# Patient Record
Sex: Female | Born: 2000 | Race: White | Hispanic: No | Marital: Married | Smoking: Never smoker
Health system: Southern US, Community
[De-identification: ages and names within clinical notes are randomized; demographics above are authoritative.]

## PROBLEM LIST (undated history)

## (undated) DIAGNOSIS — F32A Depression, unspecified: Secondary | ICD-10-CM

## (undated) DIAGNOSIS — F99 Mental disorder, not otherwise specified: Secondary | ICD-10-CM

## (undated) DIAGNOSIS — I10 Essential (primary) hypertension: Secondary | ICD-10-CM

## (undated) HISTORY — PX: EYE SURGERY: SHX253

## (undated) HISTORY — PX: STRABISMUS SURGERY: SHX218

## (undated) HISTORY — DX: Depression, unspecified: F32.A

## (undated) HISTORY — DX: Mental disorder, not otherwise specified: F99

## (undated) HISTORY — DX: Essential (primary) hypertension: I10

---
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
## 2022-10-27 ENCOUNTER — Ambulatory Visit: Payer: 59 | Admitting: Podiatry

## 2022-10-27 DIAGNOSIS — L600 Ingrowing nail: Secondary | ICD-10-CM

## 2022-10-27 NOTE — Patient Instructions (Signed)

## 2022-10-27 NOTE — Progress Notes (Signed)
  Subjective:  Patient ID: Karen Crawford, female    DOB: 08-02-2001,  MRN: [id number]  Chief Complaint  Patient presents with   Ingrown Toenail    RIGHT GREAT TOE INGROWN    21 y.o. female presents with concern for for right hallux ingrown nail on the medial border.  She says this is been present for weeks to months.  She has tried to trim her back herself but has not been successful.  Does note that there has been some drainage in the past but not currently draining.  Has pain with pressure on the area and with wearing certain shoes.  No past medical history on file.  Allergies  Allergen Reactions   Penicillins Hives    ROS: Negative except as per HPI above  Objective:  General: AAO x3, NAD  Dermatological: Incurvation is present along the medial nail border of the right great toe. There is localized edema without any erythema or increase in warmth around the nail border. There is no drainage or pus. There is no ascending cellulitis. No malodor. No open lesions or pre-ulcerative lesions.    Vascular:  Dorsalis Pedis artery and Posterior Tibial artery pedal pulses are 2/4 bilateral.  Capillary fill time < 3 sec to all digits.   Neruologic: Grossly intact via light touch bilateral. Protective threshold intact to all sites bilateral.   Musculoskeletal: No gross boney pedal deformities bilateral. No pain, crepitus, or limitation noted with foot and ankle range of motion bilateral. Muscular strength 5/5 in all groups tested bilateral.  Gait: Unassisted, Nonantalgic.    Assessment:   1. Ingrown nail of great toe of right foot      Plan:  Patient was evaluated and treated and all questions answered.    Ingrown Nail, right -Patient elects to proceed with minor surgery to remove ingrown toenail today. Consent reviewed and signed by patient. -Ingrown nail excised. See procedure note. -Educated on post-procedure care including soaking. Written instructions provided and  reviewed. -Patient to follow up in 2 weeks for nail check.  Procedure: Excision of Ingrown Toenail Location: Right 1st toe medial nail borders. Anesthesia: Lidocaine 1% plain; 1.5 mL and Marcaine 0.5% plain; 1.5 mL, digital block. Skin Prep: Betadine. Dressing: Silvadene; telfa; dry, sterile, compression dressing. Technique: Following skin prep, the toe was exsanguinated and a tourniquet was secured at the base of the toe. The affected nail border was freed, split with a nail splitter, and excised. Chemical matrixectomy was then performed with phenol and irrigated out with alcohol. The tourniquet was then removed and sterile dressing applied. Disposition: Patient tolerated procedure well. Patient to return in 2 weeks for follow-up.    Return in about 2 weeks (around 11/10/2022) for Follow-up right hallux medial ingrown phenol matrixectomy.Corinna Gab, DPM [facility] / [facility]

## 2022-10-28 ENCOUNTER — Telehealth: Payer: Self-pay | Admitting: *Deleted

## 2022-10-28 ENCOUNTER — Encounter: Payer: Self-pay | Admitting: Podiatry

## 2022-10-28 NOTE — Telephone Encounter (Signed)
Patient is requesting an out of work note for 10/28/22 , no way she can work with this toe,is hurting really bad today, is off tomorrow and should be fine. Please email to her address on file.

## 2022-11-04 NOTE — Telephone Encounter (Signed)
Called and left voice message that work note has been completed.

## 2022-11-11 ENCOUNTER — Ambulatory Visit: Payer: 59 | Admitting: Podiatry

## 2022-11-11 DIAGNOSIS — L600 Ingrowing nail: Secondary | ICD-10-CM | POA: Diagnosis not present

## 2022-11-11 NOTE — Progress Notes (Signed)
Subjective: Karen Crawford is a 21 y.o.  female returns to office today for follow up evaluation after having right Hallux medial border nail ingrown removal with phenol and alcohol matrixectomy approximately 2 weeks ago. Patient has been soaking using epsom salts and applying topical antibiotic covered with bandaid daily. Patient denies fevers, chills, nausea, vomiting. Denies any calf pain, chest pain, SOB.   Objective:  Vitals: Reviewed  General: Well developed, nourished, in no acute distress, alert and oriented x3   Dermatology: Skin is warm, dry and supple bilateral. right hallux nail border appears to be clean, dry, with mild granular tissue and surrounding scab. There is no surrounding erythema, edema, drainage/purulence. The remaining nails appear unremarkable at this time. There are no other lesions or other signs of infection present.  Neurovascular status: Intact. No lower extremity swelling; No pain with calf compression bilateral.  Musculoskeletal: Decreased tenderness to palpation of the right hallux nail fold(s). Muscular strength within normal limits bilateral.   Assesement and Plan: S/p phenol and alcohol matrixectomy to the  right hallux nail medial border, doing well.   -Continue soaking in epsom salts twice a day followed by antibiotic ointment and a band-aid. Can leave uncovered at night. Continue this until completely healed.  -If the area has not healed in 2 weeks, call the office for follow-up appointment, or sooner if any problems arise.  -Monitor for any signs/symptoms of infection. Call the office immediately if any occur or go directly to the emergency room. Call with any questions/concerns.        Corinna Gab, DPM [facility]                   11/11/2022

## 2022-11-24 NOTE — L&D Delivery Note (Signed)
OB/GYN Faculty Practice Delivery Note  Karen Crawford is a 22 y.o. G2P1011 s/p SVD at [redacted]w[redacted]d. She was admitted for [facility].   ROM: 7h 32m with clear fluid GBS Status: Negative/-- (09/17 1157)    Labor Progress: Initial SVE: 1 cm. Outpt FB done and after it was out, she was 4 cm. AROM done and pitocin started. She was unchanged four hours later. She then progressed to complete.   Delivery Date/Time: 0223, 9/27 Delivery: Called to room and patient was complete and pushing. Head delivered LOA. Nuchal cord present. Shoulder and body delivered in usual fashion. Infant with spontaneous cry, placed on mother's abdomen, dried and stimulated. Cord clamped x 2 after 1-minute delay, and cut by spouse/FOB. Cord blood drawn. Placenta delivered spontaneously with gentle cord traction. Fundus firm with massage and Pitocin. Labia, perineum, vagina, and cervix inspected inspected with shallow vaginal abrasion repaired with single interrupted suture with 4-0 vicryl for hemostasis.   Baby Weight: 2730g  Placenta: Sent to L&D Complications: None Lacerations: Shallow vaginal abrasion EBL: 134 mL Analgesia: Epidural   Infant:  APGAR (1 MIN): 8  APGAR (5 MINS): 9  APGAR (10 MINS):

## 2023-02-02 DIAGNOSIS — Z5941 Food insecurity: Secondary | ICD-10-CM | POA: Insufficient documentation

## 2023-04-08 ENCOUNTER — Ambulatory Visit (INDEPENDENT_AMBULATORY_CARE_PROVIDER_SITE_OTHER): Payer: Medicaid Other | Admitting: Family Medicine

## 2023-04-08 ENCOUNTER — Encounter: Payer: Self-pay | Admitting: Family Medicine

## 2023-04-08 VITALS — BP 133/60 | HR 99 | Ht 64.0 in | Wt 134.0 lb

## 2023-04-08 DIAGNOSIS — F419 Anxiety disorder, unspecified: Secondary | ICD-10-CM | POA: Diagnosis not present

## 2023-04-08 DIAGNOSIS — Z3A17 17 weeks gestation of pregnancy: Secondary | ICD-10-CM

## 2023-04-08 DIAGNOSIS — Z3480 Encounter for supervision of other normal pregnancy, unspecified trimester: Secondary | ICD-10-CM

## 2023-04-08 DIAGNOSIS — I10 Essential (primary) hypertension: Secondary | ICD-10-CM

## 2023-04-08 HISTORY — DX: Encounter for supervision of other normal pregnancy, unspecified trimester: Z34.80

## 2023-04-08 MED ORDER — HYDROXYZINE PAMOATE 25 MG PO CAPS: 25.0000 mg | ORAL_CAPSULE | Freq: Three times a day (TID) | ORAL | 0 refills | Status: AC | PRN

## 2023-04-08 MED ORDER — ASPIRIN 81 MG PO TBEC
81.0000 mg | DELAYED_RELEASE_TABLET | Freq: Every day | ORAL | 2 refills | Status: DC
Start: 2023-04-08 — End: 2023-08-28

## 2023-04-08 NOTE — Progress Notes (Signed)
Subjective:  Karen Crawford is a G2P0010 [redacted]w[redacted]d being seen today for her first obstetrical visit.  She was initially seen by [facility], but transferred here in order to deliver in [place]. Her obstetrical history is significant for  [facility] . Patient does intend to breast feed. Pregnancy history fully reviewed.  Patient reports no complaints.  BP 133/60   Pulse 99   Ht 5\' 4"  (1.626 m)   Wt 134 lb (60.8 kg)   LMP 12/04/2022   BMI 23.00 kg/m   HISTORY: OB History  Gravida Para Term Preterm AB Living  2       1    SAB IAB Ectopic Multiple Live Births    1          # Outcome Date GA Lbr Len/2nd Weight Sex Delivery Anes PTL Lv  2 Current           1 IAB             Past Medical History:  Diagnosis Date   Hypertension    Mental disorder    anxiety and depression    Past Surgical History:  Procedure Laterality Date   EYE SURGERY     STRABISMUS SURGERY      Family History  Problem Relation Age of Onset   Hypertension Mother    Hypertension Maternal Grandfather    Diabetes Maternal Grandfather    Cancer Neg Hx      Exam  BP 133/60   Pulse 99   Ht 5\' 4"  (1.626 m)   Wt 134 lb (60.8 kg)   LMP 12/04/2022   BMI 23.00 kg/m   Chaperone present during exam  CONSTITUTIONAL: Well-developed, well-nourished female in no acute distress.  HENT:  Normocephalic, atraumatic, External right and left ear normal. Oropharynx is clear and moist EYES: Conjunctivae and EOM are normal. Pupils are equal, round, and reactive to light. No scleral icterus.  NECK: Normal range of motion, supple, no masses.  Normal thyroid.  CARDIOVASCULAR: Normal heart rate noted, regular rhythm RESPIRATORY: Clear to auscultation bilaterally. Effort and breath sounds normal, no problems with respiration noted. BREASTS: Previously done. ABDOMEN: Soft, normal bowel sounds, no distention noted.  No tenderness, rebound or guarding.  PELVIC: previously done MUSCULOSKELETAL: Normal range of motion. No tenderness.  No  cyanosis, clubbing, or edema.  2+ distal pulses. SKIN: Skin is warm and dry. No rash noted. Not diaphoretic. No erythema. No pallor. NEUROLOGIC: Alert and oriented to person, place, and time. Normal reflexes, muscle tone coordination. No cranial nerve deficit noted. PSYCHIATRIC: Normal mood and affect. Normal behavior. Normal judgment and thought content.    Assessment:    Pregnancy: G2P0010 Patient Active Problem List   Diagnosis Date Noted   Supervision of other normal pregnancy, antepartum 04/08/2023   Anxiety 04/08/2023   Primary hypertension 04/08/2023      Plan:   1. [redacted] weeks gestation of pregnancy - [place] MFM OB COMP + 14 WK; Future - AFP, Serum, Open Spina Bifida - Babyscripts Schedule Optimization  2. Supervision of other normal pregnancy, antepartum FHT and FH normal - [place] MFM OB COMP + 14 WK; Future - AFP, Serum, Open Spina Bifida - Ambulatory referral to [facility] - Babyscripts Schedule Optimization  3. Anxiety On prozac and buspar. Controlled Would like vistaril prn - Ambulatory referral to [facility]  4. Primary hypertension Start ASA 81mg . Needs serial [place] for growth.    Initial labs obtained Continue prenatal vitamins Reviewed n/v relief measures  and warning s/s to report Reviewed recommended weight gain based on pre-gravid BMI Encouraged well-balanced diet The nature of [place] [facility] with multiple MDs and other Advanced Practice Providers was explained to patient; also emphasized that fellows, residents, and students are part of our team.   Problem list reviewed and updated. 75% of 30 min visit spent on counseling and coordination of care.     Levie Heritage 04/08/2023

## 2023-04-10 LAB — AFP, SERUM, OPEN SPINA BIFIDA
AFP MoM: 0.76
AFP Value: 27.3 ng/mL
Gest. Age on Collection Date: 17.6 weeks
Maternal Age At EDD: 23.1 yr
OSBR Risk 1 IN: 9131
Test Results:: NEGATIVE
Weight: 135 [lb_av]

## 2023-04-13 ENCOUNTER — Telehealth: Payer: Self-pay | Admitting: Clinical

## 2023-04-13 NOTE — Telephone Encounter (Signed)
Attempt call regarding referral; Left HIPPA-compliant message to call back Lashawnna Lambrecht from [facility]  [facility] at  [phone] (Elmer Boutelle's office).   

## 2023-04-16 ENCOUNTER — Encounter: Payer: Self-pay | Admitting: Family Medicine

## 2023-04-21 ENCOUNTER — Inpatient Hospital Stay (HOSPITAL_COMMUNITY)
Admission: AD | Admit: 2023-04-21 | Discharge: 2023-04-21 | Disposition: A | Discharge status: Home / Self Care | Payer: 59 | Attending: Family Medicine | Admitting: Family Medicine

## 2023-04-21 ENCOUNTER — Encounter (HOSPITAL_COMMUNITY): Payer: Self-pay | Admitting: Family Medicine

## 2023-04-21 DIAGNOSIS — L309 Dermatitis, unspecified: Secondary | ICD-10-CM | POA: Insufficient documentation

## 2023-04-21 DIAGNOSIS — Z3A19 19 weeks gestation of pregnancy: Secondary | ICD-10-CM | POA: Insufficient documentation

## 2023-04-21 DIAGNOSIS — O26892 Other specified pregnancy related conditions, second trimester: Secondary | ICD-10-CM | POA: Diagnosis present

## 2023-04-21 DIAGNOSIS — L300 Nummular dermatitis: Secondary | ICD-10-CM

## 2023-04-21 DIAGNOSIS — Z3480 Encounter for supervision of other normal pregnancy, unspecified trimester: Secondary | ICD-10-CM

## 2023-04-21 DIAGNOSIS — L550 Sunburn of first degree: Secondary | ICD-10-CM | POA: Diagnosis not present

## 2023-04-21 MED ORDER — ACETAMINOPHEN 325 MG PO TABS: 650.0000 mg | ORAL_TABLET | Freq: Once | ORAL | Status: DC

## 2023-04-21 MED ORDER — IBUPROFEN 200 MG PO TABS: 200.0000 mg | ORAL_TABLET | Freq: Four times a day (QID) | ORAL | 0 refills | Status: DC | PRN

## 2023-04-21 MED ORDER — ACETAMINOPHEN 500 MG PO TABS: 500.0000 mg | ORAL_TABLET | Freq: Four times a day (QID) | ORAL | 0 refills | Status: DC | PRN

## 2023-04-21 NOTE — MAU Note (Signed)
.  Karen Crawford is a 22 y.o. at [redacted]w[redacted]d here in MAU reporting: got a bad sunburn yesterday. No blistering just stated it feels like her skin is going to burst. Reports using sunscreen and was only in the sun for 1 hour. Has tried Aloe and some other creams to help sooth it but nothing is helping. Back of her legs hurt the worst. LMP: Onset of complaint: yesterday Pain score: 5-8 Vitals:   04/21/23 1641  BP: 120/75  Pulse: 96  Resp: 18  Temp: 98.1 F (36.7 C)     FHT:131 Lab orders placed from triage:

## 2023-04-21 NOTE — MAU Provider Note (Signed)
History     CSN: [id number]  Arrival date and time: 04/21/23 1606   Event Date/Time   First Provider Initiated Contact with Patient 04/21/23 1810      Chief Complaint  Patient presents with   Sunburn   HPI Karen Crawford is a 22 y.o. G2P0010 at [redacted]w[redacted]d presenting with a sunburn. She reports she went the pool yesterday. She applied sunscreen-- SPH 50 and was out in the sun for about 1 hr and they reapplied. She reports most severe sx on her lower extremities and it is "ok" when lying down but very painful with standing. She took ibuprofen at about 730 this am but did not notice too much improvement.   OB History     Gravida  2   Para      Term      Preterm      AB  1   Living         SAB      IAB  1   Ectopic      Multiple      Live Births              Past Medical History:  Diagnosis Date   Hypertension    Mental disorder    anxiety and depression    Past Surgical History:  Procedure Laterality Date   EYE SURGERY     STRABISMUS SURGERY      Family History  Problem Relation Age of Onset   Hypertension Mother    Hypertension Maternal Grandfather    Diabetes Maternal Grandfather    Cancer Neg Hx     Social History   Tobacco Use   Smoking status: Never  Vaping Use   Vaping Use: Some days  Substance Use Topics   Alcohol use: No   Drug use: Not Currently    Types: Marijuana    Allergies:  Allergies  Allergen Reactions   Sulfa Antibiotics Hives and Swelling   Pollen Extract Other (See Comments)    Congestion, runny nose    No medications prior to admission.    Review of Systems  Constitutional:  Negative for chills and fever.  HENT:  Negative for congestion and sore throat.   Eyes:  Negative for pain and visual disturbance.  Respiratory:  Negative for cough, chest tightness and shortness of breath.   Cardiovascular:  Negative for chest pain.  Gastrointestinal:  Negative for abdominal pain, diarrhea, nausea and vomiting.  Endocrine:  Negative for cold intolerance and heat intolerance.  Genitourinary:  Negative for dysuria and flank pain.  Musculoskeletal:  Negative for back pain.  Skin:  Negative for rash.  Allergic/Immunologic: Negative for food allergies.  Neurological:  Negative for dizziness and light-headedness.  Psychiatric/Behavioral:  Negative for agitation.    Physical Exam   Blood pressure 120/75, pulse 96, temperature 98.1 F (36.7 C), resp. rate 18, height 5\' 4"  (1.626 m), weight 61.7 kg, last menstrual period 12/04/2022.  Physical Exam Vitals and nursing note reviewed.  Constitutional:      General: She is not in acute distress.    Appearance: She is well-developed.     Comments: Pregnant female  HENT:     Head: Normocephalic and atraumatic.  Eyes:     General: No scleral icterus.    Conjunctiva/sclera: Conjunctivae normal.  Cardiovascular:     Rate and Rhythm: Normal rate.  Pulmonary:     Effort: Pulmonary effort is normal.  Chest:     Chest wall: No tenderness.  Abdominal:     Palpations: Abdomen is soft.     Tenderness: There is no abdominal tenderness. There is no guarding or rebound.     Comments: Gravid  Genitourinary:    Vagina: Normal.  Musculoskeletal:        General: Normal range of motion.     Cervical back: Normal range of motion and neck supple.  Skin:    General: Skin is warm and dry.     Capillary Refill: Capillary refill takes less than 2 seconds.     Findings: Erythema (diffusely on ventral body- upper and lower torso and extremities) and rash present. Rash is macular (located on right side face) and scaling.  Neurological:     Mental Status: She is alert and oriented to person, place, and time.     MAU Course  Procedures  MDM- LOW  Assessment and Plan   1. Sunburn of first degree   2. Supervision of other normal pregnancy, antepartum   3. Discoid eczema    - for sunburn recommended aloe and solarcaine application. PRN tylenol or ibuprofen for pain.  Counseled on gradual improvement.  - For what appears to be discoid eczema recommended trial of OTC hydrocortisone  Future Appointments  Date Time Provider Department Center  05/06/2023  9:35 AM Levie Heritage, DO [facility] None  05/12/2023  7:30 AM [facility] NURSE [facility]  05/12/2023  7:45 AM [facility] US5 [facility]  06/03/2023  8:15 AM Adrian Blackwater, Rhona Raider, DO [facility] None    [facility] 04/23/2023, 4:04 PM

## 2023-04-21 NOTE — Discharge Instructions (Signed)
For your sunburn - drink more water - Use aloe - Consider buying solarcaine/lidocain spray  - You can use Tylenol 500mg  and Ibuprofen 200mg  to help with the pain. I recommend taking one of them every 4 hours to stay ahead of your pain   For your red patch on your face-- most likely eczema - Purchase over the counter hydrocortisone cream-- 1 apply a small amount twice a day for 1 week - Continue to use aquaphor to hydrate the area.

## 2023-04-29 ENCOUNTER — Telehealth: Payer: Self-pay | Admitting: Clinical

## 2023-04-29 NOTE — Telephone Encounter (Signed)
Attempt call regarding referral; Left HIPPA-compliant message to call back Dorothy Landgrebe from [facility] [place] [facility] at  [phone] (Levar Fayson's office).   

## 2023-05-06 ENCOUNTER — Ambulatory Visit (INDEPENDENT_AMBULATORY_CARE_PROVIDER_SITE_OTHER): Payer: Medicaid Other | Admitting: Family Medicine

## 2023-05-06 VITALS — BP 115/73 | HR 78 | Wt 138.0 lb

## 2023-05-06 DIAGNOSIS — F419 Anxiety disorder, unspecified: Secondary | ICD-10-CM

## 2023-05-06 DIAGNOSIS — Z3A21 21 weeks gestation of pregnancy: Secondary | ICD-10-CM

## 2023-05-06 DIAGNOSIS — D229 Melanocytic nevi, unspecified: Secondary | ICD-10-CM | POA: Insufficient documentation

## 2023-05-06 DIAGNOSIS — Z3480 Encounter for supervision of other normal pregnancy, unspecified trimester: Secondary | ICD-10-CM

## 2023-05-06 HISTORY — DX: Melanocytic nevi, unspecified: D22.9

## 2023-05-06 NOTE — Progress Notes (Signed)
   PRENATAL VISIT NOTE  Subjective:  Karen Crawford is a 22 y.o. G2P0010 at [redacted]w[redacted]d being seen today for ongoing prenatal care.  She is currently monitored for the following issues for this high-risk pregnancy and has Supervision of other normal pregnancy, antepartum; Anxiety; Primary hypertension; and Skin mole on their problem list.  Patient reports no complaints.  Contractions: Not present. Vag. Bleeding: None.  Movement: Present. Denies leaking of fluid.   The following portions of the patient's history were reviewed and updated as appropriate: allergies, current medications, past family history, past medical history, past social history, past surgical history and problem list.   Objective:   Vitals:   05/06/23 0939  BP: 115/73  Pulse: 78  Weight: 138 lb (62.6 kg)    Fetal Status: Fetal Heart Rate (bpm): 140   Movement: Present     General:  Alert, oriented and cooperative. Patient is in no acute distress.  Skin: Skin is warm and dry. No rash noted.   Cardiovascular: Normal heart rate noted  Respiratory: Normal respiratory effort, no problems with respiration noted  Abdomen: Soft, gravid, appropriate for gestational age.  Pain/Pressure: Absent ("normal discomfort")     Pelvic: Cervical exam deferred        Extremities: Normal range of motion.  Edema: None  Mental Status: Normal mood and affect. Normal behavior. Normal judgment and thought content.   Assessment and Plan:  Pregnancy: G2P0010 at [redacted]w[redacted]d 1. [redacted] weeks gestation of pregnancy  2. Supervision of other normal pregnancy, antepartum FHT and FH normal  3. Anxiety  4. Skin mole Has raised mole on mons, she cuts it frequently when shaving. Her previous OB was going to remove it she had an epidural. I discussed that it may be possible to remove there - if delivering provider uncomfortable with do it then, can remove in office.  Preterm labor symptoms and general obstetric precautions including but not limited to vaginal  bleeding, contractions, leaking of fluid and fetal movement were reviewed in detail with the patient. Please refer to After Visit Summary for other counseling recommendations.   No follow-ups on file.  Future Appointments  Date Time Provider Department Center  05/12/2023  7:30 AM [facility] NURSE [facility]  05/12/2023  7:45 AM [facility] US5 [facility]  06/03/2023  8:15 AM Adrian Blackwater, Rhona Raider, DO [facility] None    Levie Heritage, DO

## 2023-05-08 ENCOUNTER — Other Ambulatory Visit: Payer: Self-pay

## 2023-05-08 DIAGNOSIS — Z3A22 22 weeks gestation of pregnancy: Secondary | ICD-10-CM

## 2023-05-08 DIAGNOSIS — O10919 Unspecified pre-existing hypertension complicating pregnancy, unspecified trimester: Secondary | ICD-10-CM

## 2023-05-12 ENCOUNTER — Ambulatory Visit: Payer: 59 | Attending: Family Medicine

## 2023-05-12 ENCOUNTER — Ambulatory Visit: Payer: 59 | Admitting: *Deleted

## 2023-05-12 ENCOUNTER — Other Ambulatory Visit: Payer: Self-pay | Admitting: *Deleted

## 2023-05-12 VITALS — BP 117/69 | HR 76

## 2023-05-12 DIAGNOSIS — O99342 Other mental disorders complicating pregnancy, second trimester: Secondary | ICD-10-CM | POA: Insufficient documentation

## 2023-05-12 DIAGNOSIS — Z3A22 22 weeks gestation of pregnancy: Secondary | ICD-10-CM | POA: Insufficient documentation

## 2023-05-12 DIAGNOSIS — O10919 Unspecified pre-existing hypertension complicating pregnancy, unspecified trimester: Secondary | ICD-10-CM

## 2023-05-12 DIAGNOSIS — Z362 Encounter for other antenatal screening follow-up: Secondary | ICD-10-CM | POA: Insufficient documentation

## 2023-05-12 DIAGNOSIS — F419 Anxiety disorder, unspecified: Secondary | ICD-10-CM | POA: Diagnosis not present

## 2023-05-12 DIAGNOSIS — O10912 Unspecified pre-existing hypertension complicating pregnancy, second trimester: Secondary | ICD-10-CM | POA: Insufficient documentation

## 2023-05-12 DIAGNOSIS — O169 Unspecified maternal hypertension, unspecified trimester: Secondary | ICD-10-CM

## 2023-05-20 ENCOUNTER — Inpatient Hospital Stay (HOSPITAL_COMMUNITY)
Admission: AD | Admit: 2023-05-20 | Discharge: 2023-05-20 | Disposition: A | Discharge status: Home / Self Care | Payer: 59 | Attending: Family Medicine | Admitting: Family Medicine

## 2023-05-20 ENCOUNTER — Encounter (HOSPITAL_COMMUNITY): Payer: Self-pay | Admitting: Family Medicine

## 2023-05-20 DIAGNOSIS — Z3492 Encounter for supervision of normal pregnancy, unspecified, second trimester: Secondary | ICD-10-CM | POA: Diagnosis not present

## 2023-05-20 DIAGNOSIS — O26892 Other specified pregnancy related conditions, second trimester: Secondary | ICD-10-CM | POA: Diagnosis not present

## 2023-05-20 DIAGNOSIS — O368120 Decreased fetal movements, second trimester, not applicable or unspecified: Secondary | ICD-10-CM | POA: Diagnosis present

## 2023-05-20 DIAGNOSIS — Z3A23 23 weeks gestation of pregnancy: Secondary | ICD-10-CM | POA: Diagnosis not present

## 2023-05-20 NOTE — MAU Provider Note (Signed)
History     CSN: [id number]  Arrival date and time: 05/20/23 1342   Event Date/Time   First Provider Initiated Contact with Patient 05/20/23 1441      Chief Complaint  Patient presents with   Decreased Fetal Movement   Abdominal Pain   HPI [name] is a 22 y.o. year old G81P0010 female at [redacted]w[redacted]d weeks gestation who presents to MAU reporting DFM x 26 hours. She reports she last felt a "subtle" kick last night prior to bed. She denies feeling any fetal movement at all today. She reports she is used to feeling FM all day. She also reports feeling occasionally feeling lower abdominal discomfort last night every few hours; rated 5/10. She denies VB or LOF.  She receives [facility] with [facility]; next appt is 06/03/2023.   OB History     Gravida  2   Para      Term      Preterm      AB  1   Living         SAB      IAB  1   Ectopic      Multiple      Live Births              Past Medical History:  Diagnosis Date   Depression    Hypertension    Mental disorder    anxiety and depression    Past Surgical History:  Procedure Laterality Date   EYE SURGERY     STRABISMUS SURGERY      Family History  Problem Relation Age of Onset   Hypertension Mother    Hypertension Maternal Grandfather    Diabetes Maternal Grandfather    Cancer Neg Hx     Social History   Tobacco Use   Smoking status: Never  Vaping Use   Vaping Use: Some days   Substances: Nicotine, Flavoring  Substance Use Topics   Alcohol use: No   Drug use: Not Currently    Allergies:  Allergies  Allergen Reactions   Sulfa Antibiotics Hives and Swelling   Pollen Extract Other (See Comments)    Congestion, runny nose    Medications Prior to Admission  Medication Sig Dispense Refill Last Dose   aspirin EC 81 MG tablet Take 1 tablet (81 mg total) by mouth daily. Take after 12 weeks for prevention of preeclampsia later in pregnancy 300 tablet 2 05/19/2023   busPIRone (BUSPAR) 10 MG tablet  Take 10 mg by mouth daily.   05/20/2023   FLUoxetine (PROZAC) 40 MG capsule Take 40 mg by mouth daily.   05/20/2023   hydrOXYzine (VISTARIL) 25 MG capsule Take 1 capsule (25 mg total) by mouth 3 (three) times daily as needed for anxiety. 30 capsule 0 Past Month   Prenatal Vit-Fe Fumarate-FA (PRENATAL MULTIVITAMIN) TABS tablet Take 1 tablet by mouth daily at 12 noon.   05/20/2023   acetaminophen (TYLENOL) 500 MG tablet Take 1 tablet (500 mg total) by mouth every 6 (six) hours as needed. 30 tablet 0    ibuprofen (ADVIL) 200 MG tablet Take 1 tablet (200 mg total) by mouth every 6 (six) hours as needed for mild pain (Do not use after 26 weeks of pregnancy). (Patient not taking: Reported on 05/12/2023) 30 tablet 0     Review of Systems  Constitutional: Negative.   HENT: Negative.    Eyes: Negative.   Respiratory: Negative.    Cardiovascular: Negative.   Gastrointestinal: Negative.  Endocrine: Negative.   Genitourinary:        DFM x 26 hrs  Musculoskeletal: Negative.   Skin: Negative.   Allergic/Immunologic: Negative.   Neurological: Negative.   Hematological: Negative.   Psychiatric/Behavioral: Negative.     Physical Exam   Blood pressure 130/77, pulse (!) 102, temperature 98.2 F (36.8 C), temperature source Oral, resp. rate 20, height 5\' 4"  (1.626 m), weight 63.5 kg, last menstrual period 12/04/2022, SpO2 100 %.  Physical Exam Vitals and nursing note reviewed.  Constitutional:      Appearance: Normal appearance. She is normal weight.  Cardiovascular:     Rate and Rhythm: Tachycardia present.  Pulmonary:     Effort: Pulmonary effort is normal.  Abdominal:     Palpations: Abdomen is soft.  Genitourinary:    Comments: Not indicated Skin:    General: Skin is warm and dry.  Neurological:     Mental Status: She is alert and oriented to person, place, and time.  Psychiatric:        Mood and Affect: Mood normal.        Behavior: Behavior normal.        Thought Content: Thought  content normal.        Judgment: Judgment normal.    REACTIVE NST - FHR: 130 bpm / moderate variability / accels present / decels absent / TOCO: none  MAU Course  Procedures  MDM CEFM  Assessment and Plan  1. Movement of fetus present during pregnancy in second trimester - Reassurance given that FHR tracing shows good fetal well-being - Patient reassured that she is feeling more FM since arriving to MAU  2. [redacted] weeks gestation of pregnancy   - Discharge patient - Keep scheduled appt with [facility] on 06/03/2023 - Patient verbalized an understanding of the plan of care and agrees.  Raelyn Mora, CNM 05/20/2023, 2:41 PM

## 2023-05-20 NOTE — MAU Note (Signed)
...  Karen Crawford is a 22 y.o. at [redacted]w[redacted]d here in MAU reporting: DFM for the past "26 hours." She reports she last felt a subtle kick last night prior to bed. Has not felt any movement all day today. She reports she is used to feeling constant movement all day. She reports last night she began experiencing occasional lower abdominal discomfort that occurs every few hours. Denies VB or LOF.   Onset of complaint: Last night Pain score: 5/10 lower abdomen  FHT: 135 initial external Lab orders placed from triage:  none - Fetal movement clicker given

## 2023-05-30 ENCOUNTER — Inpatient Hospital Stay (HOSPITAL_COMMUNITY)
Admission: AD | Admit: 2023-05-30 | Discharge: 2023-05-31 | Disposition: A | Discharge status: Home / Self Care | Payer: Medicaid Other | Attending: Obstetrics and Gynecology | Admitting: Obstetrics and Gynecology

## 2023-05-30 ENCOUNTER — Encounter (HOSPITAL_COMMUNITY): Payer: Self-pay | Admitting: Obstetrics and Gynecology

## 2023-05-30 DIAGNOSIS — Z3A25 25 weeks gestation of pregnancy: Secondary | ICD-10-CM | POA: Diagnosis not present

## 2023-05-30 DIAGNOSIS — M545 Low back pain, unspecified: Secondary | ICD-10-CM | POA: Insufficient documentation

## 2023-05-30 DIAGNOSIS — O10912 Unspecified pre-existing hypertension complicating pregnancy, second trimester: Secondary | ICD-10-CM | POA: Diagnosis not present

## 2023-05-30 DIAGNOSIS — O9A212 Injury, poisoning and certain other consequences of external causes complicating pregnancy, second trimester: Secondary | ICD-10-CM

## 2023-05-30 DIAGNOSIS — F32A Depression, unspecified: Secondary | ICD-10-CM | POA: Diagnosis not present

## 2023-05-30 DIAGNOSIS — W19XXXA Unspecified fall, initial encounter: Secondary | ICD-10-CM | POA: Insufficient documentation

## 2023-05-30 DIAGNOSIS — O99342 Other mental disorders complicating pregnancy, second trimester: Secondary | ICD-10-CM | POA: Insufficient documentation

## 2023-05-30 DIAGNOSIS — Z6790 Unspecified blood type, Rh positive: Secondary | ICD-10-CM

## 2023-05-30 DIAGNOSIS — F419 Anxiety disorder, unspecified: Secondary | ICD-10-CM | POA: Insufficient documentation

## 2023-05-30 DIAGNOSIS — O26892 Other specified pregnancy related conditions, second trimester: Secondary | ICD-10-CM | POA: Diagnosis not present

## 2023-05-30 DIAGNOSIS — Z3480 Encounter for supervision of other normal pregnancy, unspecified trimester: Secondary | ICD-10-CM

## 2023-05-30 NOTE — MAU Note (Signed)
.  Karen Crawford is a 22 y.o. at [redacted]w[redacted]d here in MAU reporting: around 7pm tonight pt fell at work  on he right side . Did not think much of it . Has been having paint o right side and some abd cramping. Reports good fetal movement since fall and denies any vag bleeding or leaking.   Onset of complaint: 7pm Pain score: 4-5 Vitals:   05/30/23 2239  BP: 127/68  Pulse: 100  Resp: 18  Temp: 98.2 F (36.8 C)     FHT:147 Lab orders placed from triage:

## 2023-05-31 ENCOUNTER — Inpatient Hospital Stay (HOSPITAL_BASED_OUTPATIENT_CLINIC_OR_DEPARTMENT_OTHER): Payer: Medicaid Other

## 2023-05-31 DIAGNOSIS — M549 Dorsalgia, unspecified: Secondary | ICD-10-CM | POA: Diagnosis not present

## 2023-05-31 DIAGNOSIS — O9A212 Injury, poisoning and certain other consequences of external causes complicating pregnancy, second trimester: Secondary | ICD-10-CM | POA: Diagnosis not present

## 2023-05-31 DIAGNOSIS — W010XXA Fall on same level from slipping, tripping and stumbling without subsequent striking against object, initial encounter: Secondary | ICD-10-CM | POA: Diagnosis not present

## 2023-05-31 DIAGNOSIS — Z3A25 25 weeks gestation of pregnancy: Secondary | ICD-10-CM

## 2023-05-31 DIAGNOSIS — R109 Unspecified abdominal pain: Secondary | ICD-10-CM

## 2023-05-31 LAB — URINALYSIS, ROUTINE W REFLEX MICROSCOPIC
Bilirubin Urine: NEGATIVE
Glucose, UA: NEGATIVE mg/dL
Hgb urine dipstick: NEGATIVE
Ketones, ur: NEGATIVE mg/dL
Nitrite: NEGATIVE
Protein, ur: NEGATIVE mg/dL
Specific Gravity, Urine: 1.016 (ref 1.005–1.030)
pH: 6 (ref 5.0–8.0)

## 2023-05-31 MED ORDER — CYCLOBENZAPRINE HCL 5 MG PO TABS: 5.0000 mg | ORAL_TABLET | Freq: Three times a day (TID) | ORAL | 0 refills | Status: DC | PRN

## 2023-05-31 MED ORDER — CALCIUM CARBONATE ANTACID 500 MG PO CHEW: 2.0000 | CHEWABLE_TABLET | Freq: Once | ORAL | Status: DC

## 2023-05-31 MED ORDER — CYCLOBENZAPRINE HCL 5 MG PO TABS
10.0000 mg | ORAL_TABLET | Freq: Once | ORAL | Status: AC
  Administered 2023-05-31: 10 mg via ORAL
  Filled 2023-05-31: qty 2

## 2023-05-31 NOTE — MAU Provider Note (Signed)
Chief Complaint:  Fall   Event Date/Time   First Provider Initiated Contact with Patient 05/31/23 0021      HPI: Karen Crawford is a 22 y.o. G2P0010 at 104w3d by early ultrasound who presents to maternity admissions reporting she was at work in the kitchen, carrying a tray with hot water for coffee, and slipped. She did not want to spill the hot water onto herself so she held the tray and did not brace herself for the fall. She landed on her right side. After the fall she had some painful cramping but this has improved since arrival to MAU.  She has constant pain in her right lower back since the fall.  She reports good fetal movement, denies LOF or vaginal bleeding    HPI  Past Medical History: Past Medical History:  Diagnosis Date   Depression    Hypertension    Mental disorder    anxiety and depression    Past obstetric history: OB History  Gravida Para Term Preterm AB Living  2       1    SAB IAB Ectopic Multiple Live Births    1          # Outcome Date GA Lbr Len/2nd Weight Sex Delivery Anes PTL Lv  2 Current           1 IAB             Past Surgical History: Past Surgical History:  Procedure Laterality Date   EYE SURGERY     STRABISMUS SURGERY      Family History: Family History  Problem Relation Age of Onset   Hypertension Mother    Hypertension Maternal Grandfather    Diabetes Maternal Grandfather    Cancer Neg Hx     Social History: Social History   Tobacco Use   Smoking status: Never  Vaping Use   Vaping Use: Some days   Substances: Nicotine, Flavoring  Substance Use Topics   Alcohol use: No   Drug use: Not Currently    Allergies:  Allergies  Allergen Reactions   Sulfa Antibiotics Hives and Swelling   Pollen Extract Other (See Comments)    Congestion, runny nose    Meds:  No medications prior to admission.    ROS:  Review of Systems  Constitutional:  Negative for chills, fatigue and fever.  Eyes:  Negative for visual disturbance.   Respiratory:  Negative for shortness of breath.   Cardiovascular:  Negative for chest pain.  Gastrointestinal:  Positive for abdominal pain. Negative for nausea and vomiting.  Genitourinary:  Negative for difficulty urinating, dysuria, flank pain, pelvic pain, vaginal bleeding, vaginal discharge and vaginal pain.  Musculoskeletal:  Positive for back pain.  Neurological:  Negative for dizziness and headaches.  Psychiatric/Behavioral: Negative.       I have reviewed patient's Past Medical Hx, Surgical Hx, Family Hx, Social Hx, medications and allergies.   Physical Exam  Patient Vitals for the past 24 hrs:  BP Temp Pulse Resp SpO2 Height Weight  05/31/23 0325 127/78 -- 79 -- -- -- --  05/31/23 0320 -- -- -- -- 100 % -- --  05/31/23 0315 -- -- -- -- 99 % -- --  05/31/23 0310 -- -- -- -- 99 % -- --  05/31/23 0305 -- -- -- -- 99 % -- --  05/31/23 0300 -- -- -- -- 99 % -- --  05/31/23 0255 -- -- -- -- 99 % -- --  05/31/23 0250 -- -- -- --  99 % -- --  05/31/23 0245 -- -- -- -- 99 % -- --  05/31/23 0240 -- -- -- -- 99 % -- --  05/31/23 0235 -- -- -- -- 99 % -- --  05/31/23 0230 -- -- -- -- 99 % -- --  05/31/23 0225 -- -- -- -- 99 % -- --  05/31/23 0220 -- -- -- -- 99 % -- --  05/31/23 0215 -- -- -- -- 98 % -- --  05/31/23 0210 -- -- -- -- 98 % -- --  05/31/23 0205 -- -- -- -- 99 % -- --  05/31/23 0200 -- -- -- -- 99 % -- --  05/30/23 2239 127/68 98.2 F (36.8 C) 100 18 -- 5\' 4"  (1.626 m) 65.4 kg   Constitutional: Well-developed, well-nourished female in no acute distress.  Cardiovascular: normal rate Respiratory: normal effort GI: Abd soft, non-tender, gravid appropriate for gestational age.  MS: Extremities nontender, no edema, normal ROM Neurologic: Alert and oriented x 4.  GU: Neg CVAT.  PELVIC EXAM: deferred     FHT:  Baseline 135 , moderate variability, accelerations present, no decelerations Contractions: none on toco or to palpation   Blood type O positive in  prenatal record  Labs: Results for orders placed or performed during the hospital encounter of 05/30/23 (from the past 24 hour(s))  Urinalysis, Routine w reflex microscopic -Urine, Clean Catch     Status: Abnormal   Collection Time: 05/30/23 11:12 PM  Result Value Ref Range   Color, Urine YELLOW YELLOW   APPearance HAZY (A) CLEAR   Specific Gravity, Urine 1.016 1.005 - 1.030   pH 6.0 5.0 - 8.0   Glucose, UA NEGATIVE NEGATIVE mg/dL   Hgb urine dipstick NEGATIVE NEGATIVE   Bilirubin Urine NEGATIVE NEGATIVE   Ketones, ur NEGATIVE NEGATIVE mg/dL   Protein, ur NEGATIVE NEGATIVE mg/dL   Nitrite NEGATIVE NEGATIVE   Leukocytes,Ua TRACE (A) NEGATIVE   RBC / HPF 0-5 0 - 5 RBC/hpf   WBC, UA 0-5 0 - 5 WBC/hpf   Bacteria, UA RARE (A) NONE SEEN   Squamous Epithelial / HPF 6-10 0 - 5 /HPF   Mucus PRESENT       Imaging:   MAU Course/MDM: Orders Placed This Encounter  Procedures   [place] MFM OB Limited   Urinalysis, Routine w reflex microscopic -Urine, Clean Catch   Discharge patient    Meds ordered this encounter  Medications   cyclobenzaprine (FLEXERIL) tablet 10 mg   calcium carbonate (TUMS - dosed in mg elemental calcium) chewable tablet 400 mg of elemental calcium   cyclobenzaprine (FLEXERIL) 5 MG tablet    Sig: Take 1-2 tablets (5-10 mg total) by mouth 3 (three) times daily as needed for muscle spasms.    Dispense:  20 tablet    Refill:  0    Order Specific Question:   Supervising Provider    Answer:   Lennart Pall [[id number]]     NST reviewed and appropriate for gestational age x 4+ hours Pt with right side/right lower back pain that is constant, c/w MSK pain No cramping/contractions Flexeril 10 mg PO given in MAU and pt pain improved D/C home with return precautions Keep scheduled appts in office Rx for Flexeril  Assessment: 1. Supervision of other normal pregnancy, antepartum   2. Traumatic injury during pregnancy in second trimester   3. Fall, initial encounter    4. [redacted] weeks gestation of pregnancy   5. Blood type, Rh positive  Plan: Discharge home Labor precautions and fetal kick counts  Follow-up Information     [facility] Follow up.   Specialty: Obstetrics and Gynecology Why: As scheduled Contact information: [address] [place] [address] [phone]        [facility] Follow up.   Specialty: Obstetrics and Gynecology Why: As needed for emergencies Contact information: [address] [id number] Wilhemina Bonito [place] [address] [phone]               Allergies as of 05/31/2023       Reactions   Sulfa Antibiotics Hives, Swelling   Pollen Extract Other (See Comments)   Congestion, runny nose        Medication List     TAKE these medications    acetaminophen 500 MG tablet Commonly known as: TYLENOL Take 1 tablet (500 mg total) by mouth every 6 (six) hours as needed.   aspirin EC 81 MG tablet Take 1 tablet (81 mg total) by mouth daily. Take after 12 weeks for prevention of preeclampsia later in pregnancy   busPIRone 10 MG tablet Commonly known as: BUSPAR Take 10 mg by mouth daily.   cyclobenzaprine 5 MG tablet Commonly known as: FLEXERIL Take 1-2 tablets (5-10 mg total) by mouth 3 (three) times daily as needed for muscle spasms.   FLUoxetine 40 MG capsule Commonly known as: PROZAC Take 40 mg by mouth daily.   hydrOXYzine 25 MG capsule Commonly known as: VISTARIL Take 1 capsule (25 mg total) by mouth 3 (three) times daily as needed for anxiety.   prenatal multivitamin Tabs tablet Take 1 tablet by mouth daily at 12 noon.        Sharen Counter Certified Nurse-Midwife 05/31/2023 8:39 AM   Nausea and Vomiting of Pregnancy Treatment Regimen:  Start on Day 1:  Before Breakfast: Prilosec 20 mg Vitamin B6 (100 mg) Phenergan 12.5 mg (1/2 tablet)  At Bedtime: Vitamin B6  (100 mg) Unisom (doxylamine 25 mg)  If symptoms do not improve:  Add 1/2 Unisom tablet at breakfast and another half at lunch, for a total of 2 tablets/day  You may also replace the bedtime Unisom with Phenergan (12.5 or 25 mg) if it works better  And, take Zofran 4 mg tablets every 6 hours in addition to these medications if you still can't keep down liquids or food

## 2023-06-03 ENCOUNTER — Ambulatory Visit (INDEPENDENT_AMBULATORY_CARE_PROVIDER_SITE_OTHER): Payer: Medicaid Other | Admitting: Family Medicine

## 2023-06-03 VITALS — BP 114/68 | HR 74 | Wt 141.0 lb

## 2023-06-03 DIAGNOSIS — I10 Essential (primary) hypertension: Secondary | ICD-10-CM

## 2023-06-03 DIAGNOSIS — F419 Anxiety disorder, unspecified: Secondary | ICD-10-CM

## 2023-06-03 DIAGNOSIS — Z3480 Encounter for supervision of other normal pregnancy, unspecified trimester: Secondary | ICD-10-CM

## 2023-06-03 DIAGNOSIS — Z3482 Encounter for supervision of other normal pregnancy, second trimester: Secondary | ICD-10-CM

## 2023-06-03 DIAGNOSIS — Z3A25 25 weeks gestation of pregnancy: Secondary | ICD-10-CM

## 2023-06-03 LAB — OB RESULTS CONSOLE RUBELLA ANTIBODY, IGM: Rubella: IMMUNE

## 2023-06-03 NOTE — Progress Notes (Signed)
   PRENATAL VISIT NOTE  Subjective:  Karen Crawford is a 22 y.o. G2P0010 at [redacted]w[redacted]d being seen today for ongoing prenatal care.  She is currently monitored for the following issues for this high-risk pregnancy and has Supervision of other normal pregnancy, antepartum; Anxiety; Primary hypertension; and Skin mole on their problem list.  Patient reports no complaints.  Contractions: Not present. Vag. Bleeding: None.  Movement: Present. Denies leaking of fluid.   The following portions of the patient's history were reviewed and updated as appropriate: allergies, current medications, past family history, past medical history, past social history, past surgical history and problem list.   Objective:   Vitals:   06/03/23 0816  BP: 114/68  Pulse: 74  Weight: 141 lb (64 kg)    Fetal Status: Fetal Heart Rate (bpm): 133   Movement: Present     General:  Alert, oriented and cooperative. Patient is in no acute distress.  Skin: Skin is warm and dry. No rash noted.   Cardiovascular: Normal heart rate noted  Respiratory: Normal respiratory effort, no problems with respiration noted  Abdomen: Soft, gravid, appropriate for gestational age.  Pain/Pressure: Absent     Pelvic: Cervical exam deferred        Extremities: Normal range of motion.  Edema: None  Mental Status: Normal mood and affect. Normal behavior. Normal judgment and thought content.   Assessment and Plan:  Pregnancy: G2P0010 at [redacted]w[redacted]d 1. [redacted] weeks gestation of pregnancy  - CBC - Glucose Tolerance, 2 Hours w/1 Hour - HIV Antibody (routine testing w rflx) - RPR  2. Supervision of other normal pregnancy, antepartum FHT and FH normal Wants to do skin to skin after baby is born - we discussed that this is normal occurrence at our hospital Also desires delayed cord clamping - affirmed that this is also normal Looking for pediatrician  - CBC - Glucose Tolerance, 2 Hours w/1 Hour - HIV Antibody (routine testing w rflx) - RPR  3.  Anxiety controlled  4. Primary hypertension BP controlled On asa 8amg  Preterm labor symptoms and general obstetric precautions including but not limited to vaginal bleeding, contractions, leaking of fluid and fetal movement were reviewed in detail with the patient. Please refer to After Visit Summary for other counseling recommendations.   No follow-ups on file.  Future Appointments  Date Time Provider Department Center  06/11/2023  7:30 AM [facility] NURSE [facility]  06/11/2023  7:45 AM [facility] US4 [facility]  07/02/2023 10:35 AM Milas Hock, MD [facility] None  07/22/2023 10:35 AM Levie Heritage, DO [facility] None    Levie Heritage, DO

## 2023-06-04 LAB — CBC
Hematocrit: 39.1 % (ref 34.0–46.6)
Hemoglobin: 13.1 g/dL (ref 11.1–15.9)
MCH: 32 pg (ref 26.6–33.0)
MCHC: 33.5 g/dL (ref 31.5–35.7)
MCV: 95 fL (ref 79–97)
Platelets: 268 10*3/uL (ref 150–450)
RBC: 4.1 x10E6/uL (ref 3.77–5.28)
RDW: 12.2 % (ref 11.7–15.4)
WBC: 10.4 10*3/uL (ref 3.4–10.8)

## 2023-06-04 LAB — RPR: RPR Ser Ql: NONREACTIVE

## 2023-06-04 LAB — GLUCOSE TOLERANCE, 2 HOURS W/ 1HR
Glucose, 1 hour: 103 mg/dL (ref 70–179)
Glucose, 2 hour: 101 mg/dL (ref 70–152)
Glucose, Fasting: 68 mg/dL — ABNORMAL LOW (ref 70–91)

## 2023-06-04 LAB — HIV ANTIBODY (ROUTINE TESTING W REFLEX): HIV Screen 4th Generation wRfx: NONREACTIVE

## 2023-06-11 ENCOUNTER — Ambulatory Visit: Payer: Medicaid Other | Attending: Obstetrics

## 2023-06-11 ENCOUNTER — Ambulatory Visit: Payer: Medicaid Other | Admitting: *Deleted

## 2023-06-11 ENCOUNTER — Other Ambulatory Visit: Payer: Self-pay | Admitting: *Deleted

## 2023-06-11 VITALS — BP 135/75 | HR 66

## 2023-06-11 DIAGNOSIS — I10 Essential (primary) hypertension: Secondary | ICD-10-CM

## 2023-06-11 DIAGNOSIS — Z362 Encounter for other antenatal screening follow-up: Secondary | ICD-10-CM

## 2023-06-11 DIAGNOSIS — O10012 Pre-existing essential hypertension complicating pregnancy, second trimester: Secondary | ICD-10-CM

## 2023-06-11 DIAGNOSIS — Z3A27 27 weeks gestation of pregnancy: Secondary | ICD-10-CM | POA: Diagnosis not present

## 2023-06-11 DIAGNOSIS — O169 Unspecified maternal hypertension, unspecified trimester: Secondary | ICD-10-CM | POA: Diagnosis not present

## 2023-06-11 DIAGNOSIS — Z3689 Encounter for other specified antenatal screening: Secondary | ICD-10-CM

## 2023-07-02 ENCOUNTER — Encounter: Payer: Medicaid Other | Admitting: Obstetrics and Gynecology

## 2023-07-06 ENCOUNTER — Encounter: Payer: Self-pay | Admitting: Obstetrics and Gynecology

## 2023-07-06 ENCOUNTER — Telehealth (INDEPENDENT_AMBULATORY_CARE_PROVIDER_SITE_OTHER): Payer: Medicaid Other | Admitting: Obstetrics and Gynecology

## 2023-07-06 VITALS — BP 135/85 | HR 79

## 2023-07-06 DIAGNOSIS — F419 Anxiety disorder, unspecified: Secondary | ICD-10-CM

## 2023-07-06 DIAGNOSIS — I10 Essential (primary) hypertension: Secondary | ICD-10-CM

## 2023-07-06 DIAGNOSIS — Z3480 Encounter for supervision of other normal pregnancy, unspecified trimester: Secondary | ICD-10-CM

## 2023-07-06 DIAGNOSIS — Z3A30 30 weeks gestation of pregnancy: Secondary | ICD-10-CM

## 2023-07-06 NOTE — Patient Instructions (Signed)
[place] Childbirth Education: Classes can vary in availability and schedule is subject to change. For most up-to-date information please visit [email or web address] to review and register.

## 2023-07-06 NOTE — Progress Notes (Signed)
ROB: denies any concerns, stating she has questions

## 2023-07-06 NOTE — Progress Notes (Signed)
OBSTETRICS PRENATAL VIRTUAL VISIT ENCOUNTER NOTE  Provider location: [facility]   Patient location: Home  I connected with [name] on 07/06/23 at 10:55 AM EDT by MyChart Video Encounter and verified that I am speaking with the correct person using two identifiers. I discussed the limitations, risks, security and privacy concerns of performing an evaluation and management service virtually and the availability of in person appointments. I also discussed with the patient that there may be a patient responsible charge related to this service. The patient expressed understanding and agreed to proceed. Subjective:  Karen Crawford is a 22 y.o. G2P0010 at [redacted]w[redacted]d being seen today for ongoing prenatal care.  She is currently monitored for the following issues for this low-risk pregnancy and has Supervision of other normal pregnancy, antepartum; Anxiety; Primary hypertension; Skin mole; and Food insecurity on their problem list.  Patient reports no complaints.  Contractions: Irregular. Vag. Bleeding: None.  Movement: Present. Denies any leaking of fluid.   The following portions of the patient's history were reviewed and updated as appropriate: allergies, current medications, past family history, past medical history, past social history, past surgical history and problem list.   Objective:   Vitals:   07/06/23 1053  BP: 135/85  Pulse: 79    Fetal Status:     Movement: Present     General:  Alert, oriented and cooperative. Patient is in no acute distress.  Respiratory: Normal respiratory effort, no problems with respiration noted  Mental Status: Normal mood and affect. Normal behavior. Normal judgment and thought content.  Rest of physical exam deferred due to type of encounter  Imaging: [place] MFM OB FOLLOW UP  Result Date: 06/11/2023 ----------------------------------------------------------------------  OBSTETRICS REPORT                        (Signed Final 06/11/2023 09:23 am) ---------------------------------------------------------------------- Patient Info  ID #:       [id number]                          D.O.B.:  February 09, 2001 (22 yrs)  Name:       Karen Crawford                    Visit Date: 06/11/2023 07:40 am ---------------------------------------------------------------------- Performed By  Attending:        Braxton Feathers DO       Secondary Phy.:   [facility] MAU/Triage  Performed By:     Kris Hartmann,      [facility]  Referred By:      Hurshel Party ---------------------------------------------------------------------- Orders  #  Description                           Code        Ordered By  1  [place] MFM OB FOLLOW UP                   [id number]    YU FANG ----------------------------------------------------------------------  #  Order #                     Accession #                Episode #  1  [id number]                   [id number]                 [id number] ---------------------------------------------------------------------- Indications  Encounter for other antenatal screening        Z36.2  follow-up  Hypertension - Chronic/Pre-existing            O10.019  [redacted] weeks gestation of pregnancy                Z3A.27 ---------------------------------------------------------------------- Fetal Evaluation  Num Of Fetuses:         1  Fetal Heart Rate(bpm):  145  Cardiac Activity:       Observed  Presentation:           Cephalic  Placenta:               Posterior  P. Cord Insertion:      Previously visualized  Amniotic Fluid  AFI FV:      Within normal limits  AFI Sum(cm)     %Tile       Largest Pocket(cm)  13.12           37          4.21  RUQ(cm)       RLQ(cm)       LUQ(cm)        LLQ(cm)  4.21           2.58          3.72           2.61 ---------------------------------------------------------------------- Biometry  BPD:      65.4  mm     G. Age:  26w 3d         20  %    CI:        69.84   %    70 - 86                                                          FL/HC:      20.3   %    18.6 - 20.4  HC:      249.7  mm     G. Age:  27w 1d         26  %    HC/AC:      1.08  1.05 - 1.21  AC:      230.4  mm     G. Age:  27w 3d         54  %    FL/BPD:     77.5   %    71 - 87  FL:       50.7  mm     G. Age:  27w 1d         41  %    FL/AC:      22.0   %    20 - 24  HUM:      45.7  mm     G. Age:  27w 0d         47  %  CER:        34  mm     G. Age:  28w 6d         98  %  LV:        3.1  mm  CM:        5.8  mm  Est. FW:    1046  gm      2 lb 5 oz     47  % ---------------------------------------------------------------------- OB History  Gravidity:    2         Term:   0        Prem:   0        SAB:   0  TOP:          1       Ectopic:  0        Living: 0 ---------------------------------------------------------------------- Gestational Age  LMP:           27w 0d        Date:  12/04/22                  EDD:   09/10/23  U/S Today:     27w 0d                                        EDD:   09/10/23  Best:          27w 0d     Det. By:  LMP  (12/04/22)          EDD:   09/10/23 ---------------------------------------------------------------------- Anatomy  Cranium:               Appears normal         LVOT:                   Not well visualized  Cavum:                 Appears normal         Aortic Arch:            Not well visualized  Ventricles:            Appears normal         Ductal Arch:            Not well visualized  Choroid Plexus:        Previously seen        Diaphragm:              Appears normal  Cerebellum:  Appears normal         Stomach:                Appears normal, left                                                                        sided  Posterior Fossa:       Appears normal         Abdomen:                 Appears normal  Nuchal Fold:           Previously seen        Abdominal Wall:         Previously seen  Face:                  Appears normal         Cord Vessels:           Appears normal (3                         (orbits and profile)                           vessel cord)  Lips:                  Appears normal         Kidneys:                Appear normal  Palate:                Appears normal         Bladder:                Appears normal  Thoracic:              Appears normal         Spine:                  Previously seen  Heart:                 Appears normal         Upper Extremities:      Appears normal (                         (4CH, axis, and                                hands )                         situs)  RVOT:                  Not well visualized    Lower Extremities:      Appears normal (  feet )  Other:  IVC, SVC, 3VV, 3VTV not well visualized. Nasal bone, lenses,          maxilla, mandible and falx visualized Hands and feet previously          visualized. Hands and feet visualized. Female gender. ---------------------------------------------------------------------- Cervix Uterus Adnexa  Right Ovary  Not visualized.  Left Ovary  Not visualized. ---------------------------------------------------------------------- Comments  The patient is here for ultrasound at 27w 0d. EDD:  09/10/2023 dated by LMP (12/04/22). Her pregnancy is  complicated by [facility] (no meds). Her BP is 135/75 today and  she has no medications. She has no further concerns today.  Sonographic findings  Single intrauterine pregnancy.  Fetal cardiac activity:  Observed and appears normal.  Presentation: Cephalic.  Interval fetal anatomy appears normal.  Fetal biometry shows the estimated fetal weight at the 47  percentile.  Amniotic fluid volume: Within normal limits. MVP: 4.21 cm.  Placenta: Posterior.  Recommendations  -Serial growth ultrasounds every  4-6 weeks until delivery  -Antenatal testing is indicated if blood pressure medicine is  needed to achieve a BP of <140/90. ----------------------------------------------------------------------                  Braxton Feathers, DO Electronically Signed Final Report   06/11/2023 09:23 am ----------------------------------------------------------------------    Assessment and Plan:  Pregnancy: G2P0010 at [redacted]w[redacted]d 1. Primary hypertension Home BP normal. No meds.  Growth wnl. Has next one on 8/29.  Baseline labs from [facility] - p/c 73, cr 05, alt 13, ast 15.   2. Anxiety On buspar and Prozac.   3. Supervision of other normal pregnancy, antepartum 28 weeks wnl.  Offer TDAP next visit.  Discussed placental encapsulation. Recommended against due to no proven benefit and risk of GBS transmission. However, if she is still desiring this, I will send her information on places to encapsulate. Reviewed no place is regulated or standardized for sterility, etc.  Reviewed PNR from [facility]: O+/-, RI, Nrx4, Materni 21 wnl.   4. [redacted] weeks gestation of pregnancy   Preterm labor symptoms and general obstetric precautions including but not limited to vaginal bleeding, contractions, leaking of fluid and fetal movement were reviewed in detail with the patient. I discussed the assessment and treatment plan with the patient. The patient was provided an opportunity to ask questions and all were answered. The patient agreed with the plan and demonstrated an understanding of the instructions. The patient was advised to call back or seek an in-person office evaluation/go to MAU at [facility] for any urgent or concerning symptoms. Please refer to After Visit Summary for other counseling recommendations.   I provided 13 minutes of face-to-face time during this encounter.  Return in about 2 weeks (around 07/20/2023) for OB VISIT, MD or APP.  Future Appointments  Date Time Provider Department Center  07/22/2023  10:35 AM Levie Heritage, DO [facility] None  07/23/2023  3:30 PM [facility] NURSE [facility]  07/23/2023  3:45 PM [facility] US4 [facility]  08/03/2023  9:15 AM Milas Hock, MD [facility] None  08/12/2023  9:35 AM Levie Heritage, DO [facility] None  08/19/2023  9:35 AM Levie Heritage, DO [facility] None  08/26/2023  9:55 AM Adrian Blackwater, Rhona Raider, DO [facility] None    Milas Hock, MD [facility] for [facility]

## 2023-07-14 ENCOUNTER — Telehealth: Payer: Medicaid Other | Admitting: Physician Assistant

## 2023-07-14 DIAGNOSIS — J302 Other seasonal allergic rhinitis: Secondary | ICD-10-CM

## 2023-07-15 NOTE — Progress Notes (Signed)
E visit for Allergic Rhinitis We are sorry that you are not feeling well.  Here is how we plan to help!  Based on what you have shared with me it looks like you have Allergic Rhinitis.  Rhinitis is when a reaction occurs that causes nasal congestion, runny nose, sneezing, and itching.  Most types of rhinitis are caused by an inflammation and are associated with symptoms in the eyes ears or throat. There are several types of rhinitis.  The most common are acute rhinitis, which is usually caused by a viral illness, allergic or seasonal rhinitis, and nonallergic or year-round rhinitis.  Nasal allergies occur certain times of the year.  Allergic rhinitis is caused when allergens in the air trigger the release of histamine in the body.  Histamine causes itching, swelling, and fluid to build up in the fragile linings of the nasal passages, sinuses and eyelids.  An itchy nose and clear discharge are common.  I recommend the following over the counter treatments: You should take a daily dose of antihistamine Claritin (Loratadine) 10mg  daily  I also would recommend a nasal spray: Flonase 2 sprays into each nostril once daily  You may also benefit from eye drops such as: Visine Allergy 1-2 drops each eye twice daily as needed  These are all found OTC at the pharmacy and are safe in pregnancy  HOME CARE:  You can use an over-the-counter saline nasal spray as needed Avoid areas where there is heavy dust, mites, or molds Stay indoors on windy days during the pollen season Keep windows closed in home, at least in bedroom; use air conditioner. Use high-efficiency house air filter Keep windows closed in car, turn AC on re-circulate Avoid playing out with dog during pollen season  GET HELP RIGHT AWAY IF:  If your symptoms do not improve within 10 days You become short of breath You develop yellow or green discharge from your nose for over 3 days You have coughing fits  MAKE SURE YOU:  Understand  these instructions Will watch your condition Will get help right away if you are not doing well or get worse  Thank you for choosing an e-visit. Your e-visit answers were reviewed by a board certified advanced clinical practitioner to complete your personal care plan. Depending upon the condition, your plan could have included both over the counter or prescription medications. Please review your pharmacy choice. Be sure that the pharmacy you have chosen is open so that you can pick up your prescription now.  If there is a problem you may message your provider in MyChart to have the prescription routed to another pharmacy. Your safety is important to [place]. If you have drug allergies check your prescription carefully.  For the next 24 hours, you can use MyChart to ask questions about today's visit, request a non-urgent call back, or ask for a work or school excuse from your e-visit provider. You will get an email in the next two days asking about your experience. I hope that your e-visit has been valuable and will speed your recovery.    I have spent 5 minutes in review of e-visit questionnaire, review and updating patient chart, medical decision making and response to patient.   Margaretann Loveless, PA-C

## 2023-07-16 ENCOUNTER — Encounter: Payer: Self-pay | Admitting: *Deleted

## 2023-07-22 ENCOUNTER — Ambulatory Visit: Payer: Medicaid Other | Admitting: Family Medicine

## 2023-07-22 VITALS — BP 123/81 | HR 89 | Wt 143.0 lb

## 2023-07-22 DIAGNOSIS — Z3A32 32 weeks gestation of pregnancy: Secondary | ICD-10-CM

## 2023-07-22 DIAGNOSIS — I10 Essential (primary) hypertension: Secondary | ICD-10-CM

## 2023-07-22 DIAGNOSIS — Z3480 Encounter for supervision of other normal pregnancy, unspecified trimester: Secondary | ICD-10-CM

## 2023-07-22 DIAGNOSIS — F419 Anxiety disorder, unspecified: Secondary | ICD-10-CM

## 2023-07-22 MED ORDER — AZITHROMYCIN 250 MG PO TABS
250.0000 mg | ORAL_TABLET | ORAL | 0 refills | Status: DC
Start: 2023-07-22 — End: 2023-08-03

## 2023-07-22 NOTE — Progress Notes (Signed)
   PRENATAL VISIT NOTE  Subjective:  Karen Crawford is a 22 y.o. G2P0010 at [redacted]w[redacted]d being seen today for ongoing prenatal care.  She is currently monitored for the following issues for this low-risk pregnancy and has Anxiety; Primary hypertension; and Food insecurity on their problem list.  Patient reports no complaints. Has sinus infection x 10+ days. Has a lot of sinus pressure/headache. Worse with chewing. Contractions: Not present. Vag. Bleeding: None.  Movement: Present. Denies leaking of fluid.   The following portions of the patient's history were reviewed and updated as appropriate: allergies, current medications, past family history, past medical history, past social history, past surgical history and problem list.   Objective:   Vitals:   07/22/23 1042  BP: 123/81  Pulse: 89  Weight: 143 lb (64.9 kg)    Fetal Status: Fetal Heart Rate (bpm): 131   Movement: Present     General:  Alert, oriented and cooperative. Patient is in no acute distress.  Skin: Skin is warm and dry. No rash noted.   Cardiovascular: Normal heart rate noted  Respiratory: Normal respiratory effort, no problems with respiration noted  Abdomen: Soft, gravid, appropriate for gestational age.  Pain/Pressure: Present     Pelvic: Cervical exam deferred        Extremities: Normal range of motion.  Edema: Trace  Mental Status: Normal mood and affect. Normal behavior. Normal judgment and thought content.   Assessment and Plan:  Pregnancy: G2P0010 at [redacted]w[redacted]d 1. [redacted] weeks gestation of pregnancy  2. Supervision of other normal pregnancy, antepartum FHT normal Discussed labor  3. Primary hypertension BP has remained controlled the entire pregnancy. Continue growth [place].  4. Anxiety On fluoxetine and buspar.   Preterm labor symptoms and general obstetric precautions including but not limited to vaginal bleeding, contractions, leaking of fluid and fetal movement were reviewed in detail with the patient. Please  refer to After Visit Summary for other counseling recommendations.   No follow-ups on file.  Future Appointments  Date Time Provider Department Center  07/23/2023  3:30 PM [facility] NURSE [facility]  07/23/2023  3:45 PM [facility] US4 [facility]  08/03/2023  9:15 AM Milas Hock, MD [facility] None  08/12/2023  9:35 AM Levie Heritage, DO [facility] None  08/19/2023  9:35 AM Levie Heritage, DO [facility] None  08/26/2023  9:55 AM Levie Heritage, DO [facility] None    Levie Heritage, DO

## 2023-07-23 ENCOUNTER — Ambulatory Visit: Payer: Medicaid Other | Attending: Maternal & Fetal Medicine

## 2023-07-23 ENCOUNTER — Other Ambulatory Visit: Payer: Self-pay | Admitting: *Deleted

## 2023-07-23 ENCOUNTER — Ambulatory Visit: Payer: Medicaid Other | Admitting: *Deleted

## 2023-07-23 VITALS — BP 118/72 | HR 88

## 2023-07-23 DIAGNOSIS — Z3480 Encounter for supervision of other normal pregnancy, unspecified trimester: Secondary | ICD-10-CM | POA: Insufficient documentation

## 2023-07-23 DIAGNOSIS — O10913 Unspecified pre-existing hypertension complicating pregnancy, third trimester: Secondary | ICD-10-CM | POA: Diagnosis not present

## 2023-07-23 DIAGNOSIS — Z3A33 33 weeks gestation of pregnancy: Secondary | ICD-10-CM | POA: Insufficient documentation

## 2023-07-23 DIAGNOSIS — Z3689 Encounter for other specified antenatal screening: Secondary | ICD-10-CM | POA: Insufficient documentation

## 2023-07-23 DIAGNOSIS — Z362 Encounter for other antenatal screening follow-up: Secondary | ICD-10-CM | POA: Insufficient documentation

## 2023-07-23 DIAGNOSIS — O10013 Pre-existing essential hypertension complicating pregnancy, third trimester: Secondary | ICD-10-CM | POA: Diagnosis not present

## 2023-08-03 ENCOUNTER — Ambulatory Visit (INDEPENDENT_AMBULATORY_CARE_PROVIDER_SITE_OTHER): Payer: Medicaid Other | Admitting: Obstetrics and Gynecology

## 2023-08-03 VITALS — BP 120/82 | HR 78 | Wt 151.0 lb

## 2023-08-03 DIAGNOSIS — Z3480 Encounter for supervision of other normal pregnancy, unspecified trimester: Secondary | ICD-10-CM

## 2023-08-03 DIAGNOSIS — F419 Anxiety disorder, unspecified: Secondary | ICD-10-CM

## 2023-08-03 DIAGNOSIS — I10 Essential (primary) hypertension: Secondary | ICD-10-CM

## 2023-08-03 DIAGNOSIS — Z3A34 34 weeks gestation of pregnancy: Secondary | ICD-10-CM

## 2023-08-03 NOTE — Progress Notes (Signed)
Patient refused flu vaccine. Armandina Stammer RN

## 2023-08-03 NOTE — Progress Notes (Signed)
   PRENATAL VISIT NOTE  Subjective:  Karen Crawford is a 22 y.o. G2P0010 at [redacted]w[redacted]d being seen today for ongoing prenatal care.  She is currently monitored for the following issues for this low-risk pregnancy and has Supervision of other normal pregnancy, antepartum; Anxiety; Primary hypertension; and Food insecurity on their problem list.  Patient reports no complaints.  Contractions: Irritability. Vag. Bleeding: None.  Movement: Present. Denies leaking of fluid.   The following portions of the patient's history were reviewed and updated as appropriate: allergies, current medications, past family history, past medical history, past social history, past surgical history and problem list.   Objective:   Vitals:   08/03/23 0918  BP: 120/82  Pulse: 78  Weight: 151 lb (68.5 kg)    Fetal Status: Fetal Heart Rate (bpm): 125   Movement: Present     General:  Alert, oriented and cooperative. Patient is in no acute distress.  Skin: Skin is warm and dry. No rash noted.   Cardiovascular: Normal heart rate noted  Respiratory: Normal respiratory effort, no problems with respiration noted  Abdomen: Soft, gravid, appropriate for gestational age.  Pain/Pressure: Present (pressure)     Pelvic: Cervical exam deferred        Extremities: Normal range of motion.  Edema: None  Mental Status: Normal mood and affect. Normal behavior. Normal judgment and thought content.   Assessment and Plan:  Pregnancy: G2P0010 at [redacted]w[redacted]d 1. Primary hypertension No meds during pregnancy. Continue ldASA BP wnl  2. Supervision of other normal pregnancy, antepartum Growth on 9/25 Discussed birth control - she will do POPs (Slynd) Offered flu vaccine - she declines.   3. Anxiety Taking buspar, prozac, and vistaril  Preterm labor symptoms and general obstetric precautions including but not limited to vaginal bleeding, contractions, leaking of fluid and fetal movement were reviewed in detail with the patient. Please refer  to After Visit Summary for other counseling recommendations.   No follow-ups on file.  Future Appointments  Date Time Provider Department Center  08/12/2023  9:35 AM Levie Heritage, DO [facility] None  08/19/2023  9:35 AM Levie Heritage, DO [facility] None  08/19/2023  3:15 PM [facility] NURSE [facility]  08/19/2023  3:30 PM [facility] US4 [facility]  08/26/2023  9:55 AM Adrian Blackwater, Rhona Raider, DO [facility] None    Milas Hock, MD

## 2023-08-04 ENCOUNTER — Encounter: Payer: Self-pay | Admitting: Obstetrics and Gynecology

## 2023-08-04 DIAGNOSIS — R002 Palpitations: Secondary | ICD-10-CM

## 2023-08-05 ENCOUNTER — Ambulatory Visit: Payer: Medicaid Other

## 2023-08-08 ENCOUNTER — Inpatient Hospital Stay (HOSPITAL_COMMUNITY)
Admission: AD | Admit: 2023-08-08 | Discharge: 2023-08-09 | Disposition: A | Discharge status: Home / Self Care | Payer: Medicaid Other | Attending: Obstetrics & Gynecology | Admitting: Obstetrics & Gynecology

## 2023-08-08 ENCOUNTER — Encounter (HOSPITAL_COMMUNITY): Payer: Self-pay | Admitting: Obstetrics & Gynecology

## 2023-08-08 DIAGNOSIS — O26893 Other specified pregnancy related conditions, third trimester: Secondary | ICD-10-CM | POA: Diagnosis present

## 2023-08-08 DIAGNOSIS — O10913 Unspecified pre-existing hypertension complicating pregnancy, third trimester: Secondary | ICD-10-CM | POA: Diagnosis not present

## 2023-08-08 DIAGNOSIS — Z3689 Encounter for other specified antenatal screening: Secondary | ICD-10-CM

## 2023-08-08 DIAGNOSIS — Z3A35 35 weeks gestation of pregnancy: Secondary | ICD-10-CM | POA: Diagnosis not present

## 2023-08-08 DIAGNOSIS — R030 Elevated blood-pressure reading, without diagnosis of hypertension: Secondary | ICD-10-CM | POA: Insufficient documentation

## 2023-08-08 DIAGNOSIS — R519 Headache, unspecified: Secondary | ICD-10-CM | POA: Diagnosis not present

## 2023-08-08 NOTE — MAU Note (Signed)
.  Karen Crawford is a 22 y.o. at [redacted]w[redacted]d here in MAU reporting: that she had a BP that was 145/103, swelling in hands/feet, headaches on 2/3 pain scale.  She states that she called on call nurse and was advised to come in to be seen.  Pt reports headache tonight that she has taken Tylenol for and was not effective.  Denies VB and LOF.  +FM   Onset of complaint:  Pain score: 2/3/ on pain scale  Vitals:   08/08/23 2321  BP: (!) 138/97  Pulse: (!) 107  Resp: 17  Temp: 98 F (36.7 C)  SpO2: 97%     FHT:138  Lab orders placed from triage:    UA

## 2023-08-09 DIAGNOSIS — O10913 Unspecified pre-existing hypertension complicating pregnancy, third trimester: Secondary | ICD-10-CM | POA: Diagnosis not present

## 2023-08-09 DIAGNOSIS — Z3A35 35 weeks gestation of pregnancy: Secondary | ICD-10-CM

## 2023-08-09 LAB — CBC
HCT: 35.8 % — ABNORMAL LOW (ref 36.0–46.0)
Hemoglobin: 12.3 g/dL (ref 12.0–15.0)
MCH: 30.8 pg (ref 26.0–34.0)
MCHC: 34.4 g/dL (ref 30.0–36.0)
MCV: 89.7 fL (ref 80.0–100.0)
Platelets: 290 10*3/uL (ref 150–400)
RBC: 3.99 MIL/uL (ref 3.87–5.11)
RDW: 12.6 % (ref 11.5–15.5)
WBC: 13.3 10*3/uL — ABNORMAL HIGH (ref 4.0–10.5)
nRBC: 0 % (ref 0.0–0.2)

## 2023-08-09 LAB — COMPREHENSIVE METABOLIC PANEL
ALT: 18 U/L (ref 0–44)
AST: 25 U/L (ref 15–41)
Albumin: 2.7 g/dL — ABNORMAL LOW (ref 3.5–5.0)
Alkaline Phosphatase: 147 U/L — ABNORMAL HIGH (ref 38–126)
Anion gap: 12 (ref 5–15)
BUN: 8 mg/dL (ref 6–20)
CO2: 20 mmol/L — ABNORMAL LOW (ref 22–32)
Calcium: 9.4 mg/dL (ref 8.9–10.3)
Chloride: 104 mmol/L (ref 98–111)
Creatinine, Ser: 0.63 mg/dL (ref 0.44–1.00)
GFR, Estimated: >60 mL/min (ref >60)
Glucose, Bld: 95 mg/dL (ref 70–99)
Potassium: 4.1 mmol/L (ref 3.5–5.1)
Sodium: 136 mmol/L (ref 135–145)
Total Bilirubin: 0.9 mg/dL (ref 0.3–1.2)
Total Protein: 6.1 g/dL — ABNORMAL LOW (ref 6.5–8.1)

## 2023-08-09 LAB — PROTEIN / CREATININE RATIO, URINE
Creatinine, Urine: 49 mg/dL
Total Protein, Urine: <6 mg/dL

## 2023-08-09 NOTE — MAU Provider Note (Signed)
History     CSN: [id number]  Arrival date and time: 08/08/23 2304   Event Date/Time   First Provider Initiated Contact with Patient 08/09/23 0000      Chief Complaint  Patient presents with   Hypertension    Karen Crawford is a 22 y.o. G2P0010 at [redacted]w[redacted]d who receives care at [facility].  She presents today for elevated blood pressure and HA.  Patient states her blood pressure has been elevated for the past 2 weeks.  She states today she took her blood pressure and it was 140/90 and then 145/93.  She states the past few days it has been persistently 130s/90s.  Patient also reports having RUQ pain yesterday that lasted a "couple of minutes" and was resolved with laying down. Patient reports "tension type" HA that has been present "consistently" for 1-2 weeks. She tried tylenol with minimal relief of symptoms. She rates the pain a 4/10. Patient endorses a history of migraines.    OB History     Gravida  2   Para      Term      Preterm      AB  1   Living         SAB      IAB  1   Ectopic      Multiple      Live Births              Past Medical History:  Diagnosis Date   Depression    Hypertension    Mental disorder    anxiety and depression   Skin mole 05/06/2023   Supervision of other normal pregnancy, antepartum 04/08/2023              NURSING     PROVIDER      Office Location    [place]    Dating by    U/S at 13 wks      [facility] Model    Traditional    Anatomy U/S    normal      Initiated care at     [employer]     English                     LAB RESULTS       Support Person    Aidan    Genetics    NIPS:  LR female        AFP:     neg                       NT/IT (FT only)                       Past Surgical History:  Procedure Laterality Date   EYE SURGERY     STRABISMUS SURGERY      Family History  Problem Relation Age of Onset   Hypertension Mother    Hypertension Maternal Grandfather    Diabetes Maternal Grandfather     Cancer Neg Hx     Social History   Tobacco Use   Smoking status: Never  Vaping Use   Vaping status: Some Days   Substances: Nicotine, Flavoring  Substance Use Topics   Alcohol use: No   Drug use: Not Currently  Allergies:  Allergies  Allergen Reactions   Sulfa Antibiotics Hives and Swelling   Pollen Extract Other (See Comments)    Congestion, runny nose    Medications Prior to Admission  Medication Sig Dispense Refill Last Dose   acetaminophen (TYLENOL) 500 MG tablet Take 1 tablet (500 mg total) by mouth every 6 (six) hours as needed. 30 tablet 0    aspirin EC 81 MG tablet Take 1 tablet (81 mg total) by mouth daily. Take after 12 weeks for prevention of preeclampsia later in pregnancy 300 tablet 2    busPIRone (BUSPAR) 10 MG tablet Take 10 mg by mouth daily.      FLUoxetine (PROZAC) 40 MG capsule Take 40 mg by mouth daily.      hydrOXYzine (VISTARIL) 25 MG capsule Take 1 capsule (25 mg total) by mouth 3 (three) times daily as needed for anxiety. 30 capsule 0    Prenatal Vit-Fe Fumarate-FA (PRENATAL MULTIVITAMIN) TABS tablet Take 1 tablet by mouth daily at 12 noon.       Review of Systems  Eyes:  Negative for visual disturbance.  Neurological:  Positive for headaches ("Tension"). Negative for dizziness and light-headedness.   Physical Exam   Blood pressure (!) 138/97, pulse (!) 107, temperature 98 F (36.7 C), temperature source Oral, resp. rate 17, height 5\' 4"  (1.626 m), weight 69.4 kg, last menstrual period 12/04/2022, SpO2 97%.  Vitals:   08/08/23 2346 08/09/23 0005 08/09/23 0016 08/09/23 0112  BP: 128/77 129/82 129/81 129/78  Pulse: 91 91 85 79  Resp:      Temp:    98.7 F (37.1 C)  TempSrc:    Oral  SpO2:  98%  100%  Weight:      Height:         Physical Exam Vitals reviewed.  Constitutional:      Appearance: Normal appearance.  HENT:     Head: Normocephalic and atraumatic.  Eyes:     Conjunctiva/sclera: Conjunctivae normal.  Cardiovascular:      Rate and Rhythm: Normal rate.  Pulmonary:     Effort: Pulmonary effort is normal. No respiratory distress.  Abdominal:     Comments: Gravid, Appears AGA  Musculoskeletal:        General: Normal range of motion.     Cervical back: Normal range of motion.     Right lower leg: No edema.     Left lower leg: No edema.  Skin:    General: Skin is warm and dry.  Neurological:     Mental Status: She is alert and oriented to person, place, and time.  Psychiatric:        Mood and Affect: Mood normal.        Behavior: Behavior normal.     Fetal Assessment 135 bpm, Mod Var, -Decels, +Accels Toco: No ctx graphed  MAU Course   Results for orders placed or performed during the hospital encounter of 08/08/23 (from the past 24 hour(s))  Protein / creatinine ratio, urine     Status: None   Collection Time: 08/08/23 11:26 PM  Result Value Ref Range   Creatinine, Urine 49 mg/dL   Total Protein, Urine <6 mg/dL   Protein Creatinine Ratio        0.00 - 0.15 mg/mg[Cre]  CBC     Status: Abnormal   Collection Time: 08/08/23 11:47 PM  Result Value Ref Range   WBC 13.3 (H) 4.0 - 10.5 K/uL   RBC 3.99 3.87 - 5.11 MIL/uL  Hemoglobin 12.3 12.0 - 15.0 g/dL   HCT 82.9 (L) 56.2 - 13.0 %   MCV 89.7 80.0 - 100.0 fL   MCH 30.8 26.0 - 34.0 pg   MCHC 34.4 30.0 - 36.0 g/dL   RDW 86.5 78.4 - 69.6 %   Platelets 290 150 - 400 K/uL   nRBC 0.0 0.0 - 0.2 %  Comprehensive metabolic panel     Status: Abnormal   Collection Time: 08/08/23 11:47 PM  Result Value Ref Range   Sodium 136 135 - 145 mmol/L   Potassium 4.1 3.5 - 5.1 mmol/L   Chloride 104 98 - 111 mmol/L   CO2 20 (L) 22 - 32 mmol/L   Glucose, Bld 95 70 - 99 mg/dL   BUN 8 6 - 20 mg/dL   Creatinine, Ser 2.95 0.44 - 1.00 mg/dL   Calcium 9.4 8.9 - 28.4 mg/dL   Total Protein 6.1 (L) 6.5 - 8.1 g/dL   Albumin 2.7 (L) 3.5 - 5.0 g/dL   AST 25 15 - 41 U/L   ALT 18 0 - 44 U/L   Alkaline Phosphatase 147 (H) 38 - 126 U/L   Total Bilirubin 0.9 0.3 - 1.2 mg/dL    GFR, Estimated >13 >24 mL/min   Anion gap 12 5 - 15   No results found.  MDM Physical Exam Labs: CBC, CMP, PC Ratio Measure BPQ15 min EFM Assessment and Plan  22 year old G2P0010  SIUP at 35.3 weeks Cat I FT CHTN with elevated BP Headache  -POC reviewed.  -Exam performed. -Labs ordered. -Patient offered and declines pain medication. -NST reactive. -Monitor and reassess.    Cherre Robins MSN, CNM 08/09/2023, 12:00 AM   Reassessment (1:00 AM) -Results as above. -Provider to bedside to discuss. -Questions regarding results and symptoms addressed. -Encouraged to return for any concerns. -Follow up as scheduled. -Discharged to home in stable condition.  Cherre Robins MSN, CNM Advanced Practice Provider, [facility] for [employer]

## 2023-08-09 NOTE — MAU Provider Note (Incomplete)
History     CSN: [id number]  Arrival date and time: 08/08/23 2304   Event Date/Time   First Provider Initiated Contact with Patient 08/09/23 0000      Chief Complaint  Patient presents with  . Hypertension    Karen Crawford is a 22 y.o. G2P0010 at [redacted]w[redacted]d who receives care at [facility].  She presents today for elevated blood pressure and HA.  Patient states her blood pressure has been elevated for the past 2 weeks.  She states today she took her blood pressure and it was 140/90 and then 145/93.  She states the past few days it has been persistently 130s/90s.  Patient also reports having RUQ pain yesterday that lasted a "couple of minutes" and was resolved with laying down. Patient reports "tension type" HA that has been present "consistently" for 1-2 weeks. She tried tylenol with minimal relief of symptoms. She rates the pain a 4/10. Patient endorses a history of migraines.    OB History     Gravida  2   Para      Term      Preterm      AB  1   Living         SAB      IAB  1   Ectopic      Multiple      Live Births              Past Medical History:  Diagnosis Date  . Depression   . Hypertension   . Mental disorder    anxiety and depression  . Skin mole 05/06/2023  . Supervision of other normal pregnancy, antepartum 04/08/2023              NURSING     PROVIDER      Office Location    [place]    Dating by    U/S at 13 wks      [facility] Model    Traditional    Anatomy U/S    normal      Initiated care at     [employer]     English                     LAB RESULTS       Support Person    Aidan    Genetics    NIPS:  LR female        AFP:     neg                       NT/IT (FT only)                       Past Surgical History:  Procedure Laterality Date  . EYE SURGERY    . STRABISMUS SURGERY      Family History  Problem Relation Age of Onset  . Hypertension Mother   . Hypertension Maternal Grandfather   . Diabetes Maternal  Grandfather   . Cancer Neg Hx     Social History   Tobacco Use  . Smoking status: Never  Vaping Use  . Vaping status: Some Days  . Substances: Nicotine, Flavoring  Substance Use Topics  . Alcohol use: No  . Drug use: Not Currently  Allergies:  Allergies  Allergen Reactions  . Sulfa Antibiotics Hives and Swelling  . Pollen Extract Other (See Comments)    Congestion, runny nose    Medications Prior to Admission  Medication Sig Dispense Refill Last Dose  . acetaminophen (TYLENOL) 500 MG tablet Take 1 tablet (500 mg total) by mouth every 6 (six) hours as needed. 30 tablet 0   . aspirin EC 81 MG tablet Take 1 tablet (81 mg total) by mouth daily. Take after 12 weeks for prevention of preeclampsia later in pregnancy 300 tablet 2   . busPIRone (BUSPAR) 10 MG tablet Take 10 mg by mouth daily.     Marland Kitchen FLUoxetine (PROZAC) 40 MG capsule Take 40 mg by mouth daily.     . hydrOXYzine (VISTARIL) 25 MG capsule Take 1 capsule (25 mg total) by mouth 3 (three) times daily as needed for anxiety. 30 capsule 0   . Prenatal Vit-Fe Fumarate-FA (PRENATAL MULTIVITAMIN) TABS tablet Take 1 tablet by mouth daily at 12 noon.       Review of Systems  Eyes:  Negative for visual disturbance.  Neurological:  Positive for headaches ("Tension"). Negative for dizziness and light-headedness.   Physical Exam   Blood pressure (!) 138/97, pulse (!) 107, temperature 98 F (36.7 C), temperature source Oral, resp. rate 17, height 5\' 4"  (1.626 m), weight 69.4 kg, last menstrual period 12/04/2022, SpO2 97%.  Physical Exam Vitals reviewed.  Constitutional:      Appearance: Normal appearance.  HENT:     Head: Normocephalic and atraumatic.  Eyes:     Conjunctiva/sclera: Conjunctivae normal.  Neurological:     Mental Status: She is alert.     Fetal Assessment 135 bpm, Mod Var, -Decels, +Accels Toco: No ctx graphed  MAU Course  No results found for this or any previous visit (from the past 24 hour(s)). No  results found.  MDM PE Labs: EFM  Assessment and Plan  *** G***P***  SIUP at ***weeks Cat *** FT   -Exam findings discussed. Cherre Robins MSN, CNM 08/09/2023, 12:00 AM

## 2023-08-11 ENCOUNTER — Ambulatory Visit (INDEPENDENT_AMBULATORY_CARE_PROVIDER_SITE_OTHER): Payer: Medicaid Other | Admitting: Obstetrics and Gynecology

## 2023-08-11 VITALS — BP 124/80 | HR 91 | Wt 152.0 lb

## 2023-08-11 DIAGNOSIS — Z3480 Encounter for supervision of other normal pregnancy, unspecified trimester: Secondary | ICD-10-CM

## 2023-08-11 DIAGNOSIS — O10913 Unspecified pre-existing hypertension complicating pregnancy, third trimester: Secondary | ICD-10-CM

## 2023-08-11 DIAGNOSIS — Z3A35 35 weeks gestation of pregnancy: Secondary | ICD-10-CM

## 2023-08-11 DIAGNOSIS — F419 Anxiety disorder, unspecified: Secondary | ICD-10-CM

## 2023-08-11 NOTE — Progress Notes (Signed)
   PRENATAL VISIT NOTE  Subjective:  Karen Crawford is a 22 y.o. G2P0010 at [redacted]w[redacted]d being seen today for ongoing prenatal care.  She is currently monitored for the following issues for this low-risk pregnancy and has Supervision of other normal pregnancy, antepartum; Anxiety; Primary hypertension; and Food insecurity on their problem list.  Patient reports  has been having uncomfortable contractions starting yesterday. Not as bad today, lasting about 90 seconds, very infrequent. .  Contractions: Irritability. Vag. Bleeding: None.  Movement: Present. Denies leaking of fluid.   The following portions of the patient's history were reviewed and updated as appropriate: allergies, current medications, past family history, past medical history, past social history, past surgical history and problem list.   Objective:   Vitals:   08/11/23 1117  BP: 124/80  Pulse: 91  Weight: 152 lb (68.9 kg)    Fetal Status: Fetal Heart Rate (bpm): 145 Fundal Height: 35 cm Movement: Present  Presentation: Vertex  General:  Alert, oriented and cooperative. Patient is in no acute distress.  Skin: Skin is warm and dry. No rash noted.   Cardiovascular: Normal heart rate noted  Respiratory: Normal respiratory effort, no problems with respiration noted  Abdomen: Soft, gravid, appropriate for gestational age.  Pain/Pressure: Present     Pelvic: Cervical exam performed in the presence of a chaperone Dilation: 1 Effacement (%): Thick Station: -3  Extremities: Normal range of motion.  Edema: None  Mental Status: Normal mood and affect. Normal behavior. Normal judgment and thought content.   Assessment and Plan:  Pregnancy: G2P0010 at [redacted]w[redacted]d 1. Supervision of other normal pregnancy, antepartum BP and FHR normal Feeling regular fetal movement FH appropriate  2. Chronic hypertension in obstetric context in third trimester Normotensive today, on ASA Precautions discussed when to return to MAU Went to MAU 9/15 for  elevated pressures, labs reviewed and normal  8/29 u/s efw 36%, afi nml, follow up growth 9/26  3. Anxiety Stable on buspar, prozac and vistaril   4. [redacted] weeks gestation of pregnancy Swabs collected today  Desired SVE 1/thick/-3 Went over some of birth plan. Does not desire pitocin, but not opposed if need it. Discussed purpose of pp pitocin  Preterm labor symptoms and general obstetric precautions including but not limited to vaginal bleeding, contractions, leaking of fluid and fetal movement were reviewed in detail with the patient. Please refer to After Visit Summary for other counseling recommendations.    Return in one week for routine prenatal   Future Appointments  Date Time Provider Department Center  08/19/2023  8:40 AM Thomasene Ripple, DO [facility] None  08/19/2023  3:15 PM [facility] NURSE [facility]  08/19/2023  3:30 PM [facility] US4 [facility]  08/20/2023 10:35 AM Levie Heritage, DO [facility] None  08/26/2023  9:55 AM Levie Heritage, DO [facility] None  09/02/2023  8:55 AM Marny Lowenstein, PA-C [facility] None  09/07/2023  9:35 AM Adam Phenix, MD [facility] None    Albertine Grates, FNP

## 2023-08-12 ENCOUNTER — Encounter: Payer: Medicaid Other | Admitting: Family Medicine

## 2023-08-13 ENCOUNTER — Inpatient Hospital Stay (HOSPITAL_COMMUNITY)
Admission: AD | Admit: 2023-08-13 | Discharge: 2023-08-14 | Disposition: A | Discharge status: Home / Self Care | Payer: Medicaid Other | Attending: Obstetrics & Gynecology | Admitting: Obstetrics & Gynecology

## 2023-08-13 ENCOUNTER — Encounter (HOSPITAL_COMMUNITY): Payer: Self-pay | Admitting: Obstetrics & Gynecology

## 2023-08-13 ENCOUNTER — Telehealth: Payer: Self-pay

## 2023-08-13 ENCOUNTER — Encounter: Payer: Self-pay | Admitting: Obstetrics and Gynecology

## 2023-08-13 DIAGNOSIS — O10013 Pre-existing essential hypertension complicating pregnancy, third trimester: Secondary | ICD-10-CM | POA: Diagnosis not present

## 2023-08-13 DIAGNOSIS — H538 Other visual disturbances: Secondary | ICD-10-CM | POA: Diagnosis present

## 2023-08-13 DIAGNOSIS — R519 Headache, unspecified: Secondary | ICD-10-CM | POA: Diagnosis present

## 2023-08-13 DIAGNOSIS — I10 Essential (primary) hypertension: Secondary | ICD-10-CM | POA: Diagnosis not present

## 2023-08-13 DIAGNOSIS — Z3A36 36 weeks gestation of pregnancy: Secondary | ICD-10-CM | POA: Insufficient documentation

## 2023-08-13 LAB — URINALYSIS, ROUTINE W REFLEX MICROSCOPIC
Bilirubin Urine: NEGATIVE
Glucose, UA: NEGATIVE mg/dL
Hgb urine dipstick: NEGATIVE
Ketones, ur: NEGATIVE mg/dL
Leukocytes,Ua: NEGATIVE
Nitrite: NEGATIVE
Protein, ur: NEGATIVE mg/dL
Specific Gravity, Urine: 1.005 (ref 1.005–1.030)
pH: 6 (ref 5.0–8.0)

## 2023-08-13 LAB — COMPREHENSIVE METABOLIC PANEL
ALT: 16 U/L (ref 0–44)
AST: 18 U/L (ref 15–41)
Albumin: 2.6 g/dL — ABNORMAL LOW (ref 3.5–5.0)
Alkaline Phosphatase: 166 U/L — ABNORMAL HIGH (ref 38–126)
Anion gap: 6 (ref 5–15)
BUN: 5 mg/dL — ABNORMAL LOW (ref 6–20)
CO2: 20 mmol/L — ABNORMAL LOW (ref 22–32)
Calcium: 8.3 mg/dL — ABNORMAL LOW (ref 8.9–10.3)
Chloride: 109 mmol/L (ref 98–111)
Creatinine, Ser: 0.51 mg/dL (ref 0.44–1.00)
GFR, Estimated: >60 mL/min (ref >60)
Glucose, Bld: 112 mg/dL — ABNORMAL HIGH (ref 70–99)
Potassium: 3.3 mmol/L — ABNORMAL LOW (ref 3.5–5.1)
Sodium: 135 mmol/L (ref 135–145)
Total Bilirubin: 0.6 mg/dL (ref 0.3–1.2)
Total Protein: 6.1 g/dL — ABNORMAL LOW (ref 6.5–8.1)

## 2023-08-13 LAB — PROTEIN / CREATININE RATIO, URINE
Creatinine, Urine: 29 mg/dL
Total Protein, Urine: <6 mg/dL

## 2023-08-13 LAB — CBC
HCT: 36 % (ref 36.0–46.0)
Hemoglobin: 12.4 g/dL (ref 12.0–15.0)
MCH: 31.2 pg (ref 26.0–34.0)
MCHC: 34.4 g/dL (ref 30.0–36.0)
MCV: 90.7 fL (ref 80.0–100.0)
Platelets: 243 10*3/uL (ref 150–400)
RBC: 3.97 MIL/uL (ref 3.87–5.11)
RDW: 12.4 % (ref 11.5–15.5)
WBC: 10.9 10*3/uL — ABNORMAL HIGH (ref 4.0–10.5)
nRBC: 0 % (ref 0.0–0.2)

## 2023-08-13 MED ORDER — LACTATED RINGERS IV BOLUS
1000.0000 mL | Freq: Once | INTRAVENOUS | Status: AC
  Administered 2023-08-13: 1000 mL via INTRAVENOUS

## 2023-08-13 MED ORDER — FAMOTIDINE 20 MG PO TABS: 20.0000 mg | ORAL_TABLET | Freq: Once | ORAL | Status: DC

## 2023-08-13 MED ORDER — ACETAMINOPHEN-CAFFEINE 500-65 MG PO TABS
2.0000 | ORAL_TABLET | Freq: Once | ORAL | Status: AC
  Administered 2023-08-13: 2 via ORAL
  Filled 2023-08-13: qty 2

## 2023-08-13 NOTE — MAU Note (Signed)
.  Karen Crawford is a 22 y.o. at [redacted]w[redacted]d here in MAU reporting HTN for past couple wks. 140s/90s. Has a h/a and seeing spots. Put this in her app for OB office and she was called and told to come in. Took Tylenol about 1730 and did not help. Denies LOF or VB and reports good FM. Dilated 1cm  Onset of complaint: wks Pain score: 5 Vitals:   08/13/23 1941 08/13/23 1946  BP:  130/85  Pulse: 99   Resp: 17   Temp: 98 F (36.7 C)   SpO2: 100%      FHT:155 Lab orders placed from triage:  u/a

## 2023-08-13 NOTE — Telephone Encounter (Signed)
[employer] sent elevated BP alert. Patient's BP was 140/91. Called patient. Left message for patient to call the office back.  Erric Machnik l Jorel Gravlin, CMA

## 2023-08-13 NOTE — MAU Provider Note (Signed)
History     CSN: [id number]  Arrival date and time: 08/13/23 1932   Event Date/Time   First Provider Initiated Contact with Patient 08/13/23 2033      Chief Complaint  Patient presents with   Hypertension   HPI Karen Crawford is a 22 y.o. G2P0010 at [redacted]w[redacted]d who presents with history of chronic hypertension here for elevated blood pressure, headache, floaters in vision. She reports history of chronic hypertension, was on Propanolol prior to pregnancy and was advised to d/c during pregnancy due to concern for effects on fetus. She has been checking her BP at home and recording using [employer]. Recenlty has noticed pattern of BP being elevated to the 140s/90s, specifically at night. She also endorses "speckles" in her vision, which are different from her typical migraine aura ("more of an orb"). She also reports a headache which started around 1430 today -- she took Tylenol around 1500, with little relief of headache. She endorses reflux. Has some swelling of hands and feet, but this has gradually worsened, not a new change. Otherwise denies CP, SOB, persistent RUQ pain. No VB, LOF. Has occ [employer]. Good FM.  Past Medical History:  Diagnosis Date   Depression    Hypertension    Mental disorder    anxiety and depression   Skin mole 05/06/2023   Supervision of other normal pregnancy, antepartum 04/08/2023              NURSING     PROVIDER      Office Location    [place]    Dating by    U/S at 13 wks      [facility] Model    Traditional    Anatomy U/S    normal      Initiated care at     [employer]     English                     LAB RESULTS       Support Person    Aidan    Genetics    NIPS:  LR female        AFP:     neg                       NT/IT (FT only)                       Past Surgical History:  Procedure Laterality Date   EYE SURGERY     STRABISMUS SURGERY      Family History  Problem Relation Age of Onset   Hypertension Mother    Hypertension  Maternal Grandfather    Diabetes Maternal Grandfather    Cancer Neg Hx     Social History   Tobacco Use   Smoking status: Never  Vaping Use   Vaping status: Some Days   Substances: Nicotine, Flavoring  Substance Use Topics   Alcohol use: No   Drug use: Not Currently    Allergies:  Allergies  Allergen Reactions   Sulfa Antibiotics Hives and Swelling   Pollen Extract Other (See Comments)    Congestion, runny nose    Medications Prior to Admission  Medication Sig Dispense Refill Last Dose   acetaminophen (TYLENOL) 500 MG tablet Take 1  tablet (500 mg total) by mouth every 6 (six) hours as needed. 30 tablet 0 08/13/2023   aspirin EC 81 MG tablet Take 1 tablet (81 mg total) by mouth daily. Take after 12 weeks for prevention of preeclampsia later in pregnancy 300 tablet 2 08/13/2023   busPIRone (BUSPAR) 10 MG tablet Take 10 mg by mouth daily.   08/13/2023   FLUoxetine (PROZAC) 40 MG capsule Take 40 mg by mouth daily.   08/13/2023   hydrOXYzine (VISTARIL) 25 MG capsule Take 1 capsule (25 mg total) by mouth 3 (three) times daily as needed for anxiety. 30 capsule 0 Past Month   Prenatal Vit-Fe Fumarate-FA (PRENATAL MULTIVITAMIN) TABS tablet Take 1 tablet by mouth daily at 12 noon.   08/13/2023   vitamin B-12 (CYANOCOBALAMIN) 100 MCG tablet Take 100 mcg by mouth daily.      ROS reviewed and pertinent positives and negatives as documented in HPI.  Physical Exam   Blood pressure 133/85, pulse 94, temperature 98 F (36.7 C), resp. rate 17, height 5\' 4"  (1.626 m), weight 70.3 kg, last menstrual period 12/04/2022, SpO2 100%.  Physical Exam Constitutional:      General: She is not in acute distress.    Appearance: Normal appearance.  HENT:     Head: Normocephalic and atraumatic.  Cardiovascular:     Rate and Rhythm: Normal rate and regular rhythm.     Heart sounds: Normal heart sounds.  Pulmonary:     Effort: Pulmonary effort is normal. No respiratory distress.     Breath sounds: Normal  breath sounds.  Abdominal:     General: There is no distension.     Palpations: Abdomen is soft.     Tenderness: There is no abdominal tenderness. There is no right CVA tenderness or left CVA tenderness.  Musculoskeletal:        General: Normal range of motion.     Right lower leg: Edema (trace nonpitting) present.     Left lower leg: Edema (trace nonpitting) present.  Skin:    General: Skin is warm and dry.     Findings: No rash.  Neurological:     General: No focal deficit present.     Mental Status: She is alert and oriented to person, place, and time.     Deep Tendon Reflexes: Reflexes normal.   EFM: 125/mod/+a/-d  MAU Course  Procedures  MDM 22 y.o. G2P0010 at [redacted]w[redacted]d here with complaint of elevated BP in setting of known cHTN (not on meds) with headache and vision changes. BP on arrival here and all subsequent BP have been borderline elevated (130s/high 80s). Checking PIH labs, cycling BP, and will give Excedrin now to see if headache improves.   12:29 AM  Pt reports headache better with IV fluids and Excedrin. Labs back -- PLT, LFTs, Cr all wnl, <6 proteinuria on urine, so unable to calculate PCR. Bps have remained normotensive in 130s/80s. Results discussed in detail with patient and her partner as well as signs/symptoms of severe pre-eclampsia. Recommended she continue to monitor BP -- parameters for return discussed as well. Stable for d/c at this time. Message sent to [facility] to have pt scheduled for BP check.  Assessment and Plan  Chronic hypertension - Plan: Discharge patient Previously on propanolol, normotensive throughout admission -- though high normal at 130s/80s -- no indication to start meds at this time -- rec continued monitoring and return precautions given -- stable for d/c  Patient discussed with Dr. Lovie Chol, MD OB  Fellow, Faculty Practice [facility], Center for [facility]  08/14/2023, 12:32 AM

## 2023-08-14 LAB — CULTURE, BETA STREP (GROUP B ONLY): Strep Gp B Culture: NEGATIVE

## 2023-08-14 NOTE — Progress Notes (Signed)
Written and verbal d/c instructions given and understanding voiced. 

## 2023-08-15 LAB — GC/CHLAMYDIA PROBE AMP
Chlamydia trachomatis, NAA: NEGATIVE
Neisseria Gonorrhoeae by PCR: NEGATIVE

## 2023-08-16 ENCOUNTER — Encounter: Payer: Self-pay | Admitting: Obstetrics and Gynecology

## 2023-08-17 ENCOUNTER — Telehealth: Payer: Self-pay

## 2023-08-17 NOTE — Telephone Encounter (Signed)
Baby scripts sent several alerts for elevated Bps. Advised pt to go to MAU but she states that is a 45 minute drive and she prefers not to go. A message will be sent to the provider. Understanding was voiced. Karen Crawford l Karen Crawford, CMA

## 2023-08-18 ENCOUNTER — Telehealth: Payer: Self-pay | Admitting: Family Medicine

## 2023-08-18 ENCOUNTER — Encounter: Payer: Self-pay | Admitting: Family Medicine

## 2023-08-18 ENCOUNTER — Other Ambulatory Visit: Payer: Self-pay | Admitting: Family Medicine

## 2023-08-18 NOTE — Telephone Encounter (Signed)
Message sent to patient regarding BP.

## 2023-08-19 ENCOUNTER — Ambulatory Visit: Payer: Medicaid Other | Admitting: *Deleted

## 2023-08-19 ENCOUNTER — Ambulatory Visit (INDEPENDENT_AMBULATORY_CARE_PROVIDER_SITE_OTHER): Payer: Medicaid Other | Admitting: Cardiology

## 2023-08-19 ENCOUNTER — Encounter: Payer: Self-pay | Admitting: Cardiology

## 2023-08-19 ENCOUNTER — Ambulatory Visit: Payer: Medicaid Other

## 2023-08-19 ENCOUNTER — Other Ambulatory Visit: Payer: Self-pay | Admitting: Obstetrics

## 2023-08-19 ENCOUNTER — Telehealth: Payer: Self-pay | Admitting: Obstetrics and Gynecology

## 2023-08-19 ENCOUNTER — Encounter: Payer: Medicaid Other | Admitting: Family Medicine

## 2023-08-19 VITALS — BP 130/84 | HR 129 | Ht 64.0 in | Wt 151.8 lb

## 2023-08-19 VITALS — BP 134/80 | HR 114

## 2023-08-19 DIAGNOSIS — O10913 Unspecified pre-existing hypertension complicating pregnancy, third trimester: Secondary | ICD-10-CM

## 2023-08-19 DIAGNOSIS — O10919 Unspecified pre-existing hypertension complicating pregnancy, unspecified trimester: Secondary | ICD-10-CM | POA: Insufficient documentation

## 2023-08-19 DIAGNOSIS — Z3A36 36 weeks gestation of pregnancy: Secondary | ICD-10-CM | POA: Diagnosis not present

## 2023-08-19 DIAGNOSIS — O10013 Pre-existing essential hypertension complicating pregnancy, third trimester: Secondary | ICD-10-CM | POA: Diagnosis not present

## 2023-08-19 DIAGNOSIS — R002 Palpitations: Secondary | ICD-10-CM | POA: Insufficient documentation

## 2023-08-19 DIAGNOSIS — Z3A37 37 weeks gestation of pregnancy: Secondary | ICD-10-CM | POA: Diagnosis not present

## 2023-08-19 MED ORDER — LABETALOL HCL 100 MG PO TABS: 100.0000 mg | ORAL_TABLET | Freq: Two times a day (BID) | ORAL | 3 refills | Status: DC

## 2023-08-19 NOTE — Telephone Encounter (Signed)
Reviewed babyscripts BP 156/94. She reported headache.   Spoke with Kieli - this headache is not new for her. She has taken Excedrin at around 8pm and it has provided some relief but incomplete. She has no blurred vision or RUQ pain. She had an appointment today with MFM and Dr. Servando Salina. At her appointment with Dr. Servando Salina she had a normal blood pressure and at her appointments in the office with [place] they have always been normal. Additionally when she came to the MAU on 9/19 her blood pressures were normal. Reviewed her babyscripts data and her blood pressures are typically higher at home.   She has reported a headache for several weeks and described the headache to me as similar to a migraine. She does have a history of migraines.   Prior to pregnancy, she had chronic hypertension and was on Propranolol. She is not on any medications currently although Dr. Servando Salina prescribed labetalol for her to start today. She then saw MFM (Dr. Grace Bushy) who recommended holding on that and then having an IOL at 37weeks.   She sees Dr. Adrian Blackwater tomorrow for routine appointment. Will route this message to Dr. Adrian Blackwater to allow for her to direct admitted following that appointment most likely.   We discussed when I would recommend coming in prior to that appointment - worsening headache despite medication. She would like to try Benadryl tonight and if no relief, she will come to MAU.  We discussed blurred vision would also be concerning and would warrant coming to MAU.   Milas Hock, MD Attending Obstetrician & Gynecologist, [facility] for [facility], [facility]

## 2023-08-19 NOTE — Progress Notes (Signed)
Cardio-Obstetrics Clinic  New Evaluation  Date:  08/19/2023   ID:  Karen Crawford, DOB 26-Oct-2001, MRN [id number]  PCP:  Daleen Squibb Nadara Mustard, FNP   [facility] Providers Cardiologist:  Thomasene Ripple, DO  Electrophysiologist:  None       Referring MD: Milas Hock, MD   Chief Complaint: I am having some high blood pressure, mostly in the 140s systolic at home.  History of Present Illness:    Karen Crawford is a 22 y.o. female [G2P0010] who is being seen today for the evaluation of palpitation and fluctuating blood pressure  at the request of Milas Hock, MD.   Karen Crawford, a pregnant patient with a history of hypertension, presents with concerns of high blood pressure and palpitations. She reports blood pressure readings at home reaching 160/110 mmHg, and consistently around 140/90 mmHg, particularly when at work. She has experienced lightheadedness and has fainted a few times, which she associates with palpitations. She describes the palpitations as a pulsation through her body, which she can feel in her pulse. She has a history of hypertension outside of pregnancy, for which she was previously on propranolol, but she has not been taking it during her pregnancy. She is currently [redacted] weeks pregnant with her first child.  Prior CV Studies Reviewed: The following studies were reviewed today:   Past Medical History:  Diagnosis Date   Depression    Hypertension    Mental disorder    anxiety and depression   Skin mole 05/06/2023   Supervision of other normal pregnancy, antepartum 04/08/2023              NURSING     PROVIDER      Office Location    [place]    Dating by    U/S at 13 wks      [facility] Model    Traditional    Anatomy U/S    normal      Initiated care at     [employer]     English                     LAB RESULTS       Support Person    Aidan    Genetics    NIPS:  LR female        AFP:     neg                       NT/IT (FT only)                        Past Surgical History:  Procedure Laterality Date   EYE SURGERY     STRABISMUS SURGERY        OB History     Gravida  2   Para      Term      Preterm      AB  1   Living         SAB      IAB  1   Ectopic      Multiple      Live Births                  Current Medications: Current Meds  Medication Sig  acetaminophen (TYLENOL) 500 MG tablet Take 1 tablet (500 mg total) by mouth every 6 (six) hours as needed.   aspirin EC 81 MG tablet Take 1 tablet (81 mg total) by mouth daily. Take after 12 weeks for prevention of preeclampsia later in pregnancy   busPIRone (BUSPAR) 10 MG tablet Take 10 mg by mouth daily.   diphenhydrAMINE (BENADRYL) 50 MG capsule Take 50 mg by mouth every 6 (six) hours as needed for sleep.   FLUoxetine (PROZAC) 40 MG capsule Take 40 mg by mouth daily.   hydrOXYzine (VISTARIL) 25 MG capsule Take 1 capsule (25 mg total) by mouth 3 (three) times daily as needed for anxiety.   labetalol (NORMODYNE) 100 MG tablet Take 1 tablet (100 mg total) by mouth 2 (two) times daily. (Patient not taking: Reported on 08/19/2023)   Magnesium 250 MG TABS Take 1 tablet by mouth daily.   Prenatal Vit-Fe Fumarate-FA (PRENATAL MULTIVITAMIN) TABS tablet Take 1 tablet by mouth daily at 12 noon.   pyridOXINE (VITAMIN B6) 100 MG tablet Take 100 mg by mouth daily.   vitamin B-12 (CYANOCOBALAMIN) 100 MCG tablet Take 500 mcg by mouth daily.     Allergies:   Sulfa antibiotics and Pollen extract   Social History   Socioeconomic History   Marital status: Married    Spouse name: Not on file   Number of children: Not on file   Years of education: Not on file   Highest education level: Not on file  Occupational History   Not on file  Tobacco Use   Smoking status: Never   Smokeless tobacco: Not on file  Vaping Use   Vaping status: Some Days   Substances: Nicotine, Flavoring  Substance and Sexual Activity   Alcohol use: No   Drug use: Not Currently   Sexual  activity: Yes  Other Topics Concern   Not on file  Social History Narrative   Not on file   Social Determinants of Health   Financial Resource Strain: Medium Risk (02/02/2023)   Received from [facility], [facility]   Overall Financial Resource Strain (CARDIA)    Difficulty of Paying Living Expenses: Somewhat hard  Food Insecurity: Food Insecurity Present (02/02/2023)   Received from [facility], [facility]   Hunger Vital Sign    Worried About Running Out of Food in the Last Year: Sometimes true    Ran Out of Food in the Last Year: Sometimes true  Transportation Needs: No Transportation Needs (02/02/2023)   Received from [employer], [facility]   PRAPARE - Transportation    Lack of Transportation (Medical): No    Lack of Transportation (Non-Medical): No  Physical Activity: Not on file  Stress: Not on file  Social Connections: Unknown (05/14/2022)   Received from [facility], [facility]   Social Network    Social Network: Not on file      Family History  Problem Relation Age of Onset   Hypertension Mother    Hypertension Maternal Grandfather    Diabetes Maternal Grandfather    Cancer Neg Hx       ROS:   Please see the history of present illness.    palpitations All other systems reviewed and are negative.   Labs/EKG Reviewed:    EKG:   EKG was not ordered today.   Recent Labs: 08/13/2023: ALT 16; BUN 5; Creatinine, Ser 0.51; Hemoglobin 12.4; Platelets 243; Potassium 3.3; Sodium 135   Recent Lipid Panel No results found for: "CHOL", "TRIG", "HDL", "  CHOLHDL", "LDLCALC", "LDLDIRECT"  Physical Exam:    VS:  BP 130/84   Pulse (!) 129   Ht 5\' 4"  (1.626 m)   Wt 151 lb 12.8 oz (68.9 kg)   LMP 12/04/2022   SpO2 96%   BMI 26.06 kg/m     Wt Readings from Last 3 Encounters:  08/19/23 151 lb 12.8 oz (68.9 kg)  08/13/23 155 lb (70.3 kg)  08/11/23 152 lb (68.9 kg)     GEN:  Well nourished, well developed in no acute distress HEENT:  Normal NECK: No JVD; No carotid bruits LYMPHATICS: No lymphadenopathy CARDIAC: RRR, no murmurs, rubs, gallops RESPIRATORY:  Clear to auscultation without rales, wheezing or rhonchi  ABDOMEN: Soft, non-tender, non-distended MUSCULOSKELETAL:  No edema; No deformity  SKIN: Warm and dry NEUROLOGIC:  Alert and oriented x 3 PSYCHIATRIC:  Normal affect    Risk Assessment/Risk Calculators:     CARPREG II Risk Prediction Index Score:  1.  The patient's risk for a primary cardiac event is 5%.            ASSESSMENT & PLAN:    Hypertension in Pregnancy Blood pressure readings as high as 160/110 at home, with consistent readings around 140/90. History of hypertension managed with Propranolol prior to pregnancy, but medication was discontinued. Patient experiencing palpitations and episodes of lightheadedness, with a few instances of fainting. -Initiate low-dose antihypertensive therapy, Labetalol 100mg  BID. -Patient to monitor blood pressure twice daily and keep a log. -Follow-up in one week with pharmacist or physician to assess response to therapy.  Palpitations Likely related to uncontrolled hypertension. Episodes associated with lightheadedness and fainting. -Management plan is to control hypertension, which should alleviate palpitations. -If palpitations persist postpartum, consider cardiac monitoring.   Patient Instructions  Medication Instructions:  Your physician has recommended you make the following change in your medication:  START: Labetalol 100 mg twice daily *If you need a refill on your cardiac medications before your next appointment, please call your pharmacy*   Lab Work: None   Testing/Procedures: None   Follow-Up: At [facility], you and your health needs are our priority.  As part of our continuing mission to provide you with exceptional heart care, we have created designated Provider Care Teams.  These Care Teams include your primary Cardiologist  (physician) and Advanced Practice Providers (APPs -  Physician Assistants and Nurse Practitioners) who all work together to provide you with the care you need, when you need it.   Your next appointment:   Oct 4th at 9:20am   Provider:   Thomasene Ripple, DO  Other instructions: Please take your blood pressure daily for 1 weeks. Please include heart rates. Bring with you to your next appointment.   HOW TO TAKE YOUR BLOOD PRESSURE: Rest 5 minutes before taking your blood pressure. Don't smoke or drink caffeinated beverages for at least 30 minutes before. Take your blood pressure before (not after) you eat. Sit comfortably with your back supported and both feet on the floor (don't cross your legs). Elevate your arm to heart level on a table or a desk. Use the proper sized cuff. It should fit smoothly and snugly around your bare upper arm. There should be enough room to slip a fingertip under the cuff. The bottom edge of the cuff should be 1 inch above the crease of the elbow. Ideally, take 3 measurements at one sitting and record the average.    Dispo:  No follow-ups on file.   Medication Adjustments/Labs and Tests  Ordered: Current medicines are reviewed at length with the patient today.  Concerns regarding medicines are outlined above.  Tests Ordered: No orders of the defined types were placed in this encounter.  Medication Changes: Meds ordered this encounter  Medications   labetalol (NORMODYNE) 100 MG tablet    Sig: Take 1 tablet (100 mg total) by mouth 2 (two) times daily.    Dispense:  180 tablet    Refill:  3

## 2023-08-19 NOTE — Patient Instructions (Addendum)
Medication Instructions:  Your physician has recommended you make the following change in your medication:  START: Labetalol 100 mg twice daily *If you need a refill on your cardiac medications before your next appointment, please call your pharmacy*   Lab Work: None   Testing/Procedures: None   Follow-Up: At [facility], you and your health needs are our priority.  As part of our continuing mission to provide you with exceptional heart care, we have created designated Provider Care Teams.  These Care Teams include your primary Cardiologist (physician) and Advanced Practice Providers (APPs -  Physician Assistants and Nurse Practitioners) who all work together to provide you with the care you need, when you need it.   Your next appointment:   Oct 4th at 9:20am   Provider:   Thomasene Ripple, DO  Other instructions: Please take your blood pressure daily for 1 weeks. Please include heart rates. Bring with you to your next appointment.   HOW TO TAKE YOUR BLOOD PRESSURE: Rest 5 minutes before taking your blood pressure. Don't smoke or drink caffeinated beverages for at least 30 minutes before. Take your blood pressure before (not after) you eat. Sit comfortably with your back supported and both feet on the floor (don't cross your legs). Elevate your arm to heart level on a table or a desk. Use the proper sized cuff. It should fit smoothly and snugly around your bare upper arm. There should be enough room to slip a fingertip under the cuff. The bottom edge of the cuff should be 1 inch above the crease of the elbow. Ideally, take 3 measurements at one sitting and record the average.

## 2023-08-20 ENCOUNTER — Inpatient Hospital Stay (HOSPITAL_COMMUNITY): Payer: Medicaid Other | Admitting: Anesthesiology

## 2023-08-20 ENCOUNTER — Inpatient Hospital Stay (HOSPITAL_COMMUNITY)
Admission: AD | Admit: 2023-08-20 | Discharge: 2023-08-23 | DRG: 807 | Disposition: A | Discharge status: Home / Self Care | Payer: Medicaid Other | Attending: Obstetrics and Gynecology | Admitting: Obstetrics and Gynecology

## 2023-08-20 ENCOUNTER — Ambulatory Visit: Payer: Medicaid Other | Admitting: Family Medicine

## 2023-08-20 ENCOUNTER — Telehealth: Payer: Self-pay | Admitting: Family Medicine

## 2023-08-20 ENCOUNTER — Encounter (HOSPITAL_COMMUNITY): Payer: Self-pay | Admitting: Family Medicine

## 2023-08-20 VITALS — BP 131/90 | HR 121 | Wt 148.0 lb

## 2023-08-20 DIAGNOSIS — N9089 Other specified noninflammatory disorders of vulva and perineum: Secondary | ICD-10-CM | POA: Diagnosis present

## 2023-08-20 DIAGNOSIS — O1092 Unspecified pre-existing hypertension complicating childbirth: Secondary | ICD-10-CM | POA: Diagnosis present

## 2023-08-20 DIAGNOSIS — Z3A37 37 weeks gestation of pregnancy: Secondary | ICD-10-CM

## 2023-08-20 DIAGNOSIS — D229 Melanocytic nevi, unspecified: Secondary | ICD-10-CM | POA: Diagnosis not present

## 2023-08-20 DIAGNOSIS — F32A Depression, unspecified: Secondary | ICD-10-CM | POA: Diagnosis present

## 2023-08-20 DIAGNOSIS — Z5986 Financial insecurity: Secondary | ICD-10-CM

## 2023-08-20 DIAGNOSIS — F419 Anxiety disorder, unspecified: Secondary | ICD-10-CM | POA: Diagnosis present

## 2023-08-20 DIAGNOSIS — O99334 Smoking (tobacco) complicating childbirth: Secondary | ICD-10-CM | POA: Diagnosis present

## 2023-08-20 DIAGNOSIS — F17290 Nicotine dependence, other tobacco product, uncomplicated: Secondary | ICD-10-CM | POA: Diagnosis present

## 2023-08-20 DIAGNOSIS — Z833 Family history of diabetes mellitus: Secondary | ICD-10-CM | POA: Diagnosis not present

## 2023-08-20 DIAGNOSIS — O134 Gestational [pregnancy-induced] hypertension without significant proteinuria, complicating childbirth: Secondary | ICD-10-CM | POA: Diagnosis present

## 2023-08-20 DIAGNOSIS — O10913 Unspecified pre-existing hypertension complicating pregnancy, third trimester: Secondary | ICD-10-CM | POA: Insufficient documentation

## 2023-08-20 DIAGNOSIS — Z3480 Encounter for supervision of other normal pregnancy, unspecified trimester: Principal | ICD-10-CM

## 2023-08-20 DIAGNOSIS — Z8249 Family history of ischemic heart disease and other diseases of the circulatory system: Secondary | ICD-10-CM

## 2023-08-20 DIAGNOSIS — O139 Gestational [pregnancy-induced] hypertension without significant proteinuria, unspecified trimester: Secondary | ICD-10-CM | POA: Diagnosis present

## 2023-08-20 DIAGNOSIS — R002 Palpitations: Secondary | ICD-10-CM | POA: Diagnosis present

## 2023-08-20 DIAGNOSIS — Z79899 Other long term (current) drug therapy: Secondary | ICD-10-CM | POA: Diagnosis not present

## 2023-08-20 DIAGNOSIS — O9972 Diseases of the skin and subcutaneous tissue complicating childbirth: Secondary | ICD-10-CM | POA: Diagnosis present

## 2023-08-20 DIAGNOSIS — O99344 Other mental disorders complicating childbirth: Secondary | ICD-10-CM | POA: Diagnosis present

## 2023-08-20 LAB — CBC
HCT: 37.5 % (ref 36.0–46.0)
Hemoglobin: 12.9 g/dL (ref 12.0–15.0)
MCH: 30 pg (ref 26.0–34.0)
MCHC: 34.4 g/dL (ref 30.0–36.0)
MCV: 87.2 fL (ref 80.0–100.0)
Platelets: 303 10*3/uL (ref 150–400)
RBC: 4.3 MIL/uL (ref 3.87–5.11)
RDW: 12.3 % (ref 11.5–15.5)
WBC: 11.6 10*3/uL — ABNORMAL HIGH (ref 4.0–10.5)
nRBC: 0 % (ref 0.0–0.2)

## 2023-08-20 LAB — COMPREHENSIVE METABOLIC PANEL
ALT: 20 U/L (ref 0–44)
AST: 19 U/L (ref 15–41)
Albumin: 2.9 g/dL — ABNORMAL LOW (ref 3.5–5.0)
Alkaline Phosphatase: 216 U/L — ABNORMAL HIGH (ref 38–126)
Anion gap: 13 (ref 5–15)
BUN: 8 mg/dL (ref 6–20)
CO2: 17 mmol/L — ABNORMAL LOW (ref 22–32)
Calcium: 9.1 mg/dL (ref 8.9–10.3)
Chloride: 105 mmol/L (ref 98–111)
Creatinine, Ser: 0.63 mg/dL (ref 0.44–1.00)
GFR, Estimated: >60 mL/min (ref >60)
Glucose, Bld: 102 mg/dL — ABNORMAL HIGH (ref 70–99)
Potassium: 3.8 mmol/L (ref 3.5–5.1)
Sodium: 135 mmol/L (ref 135–145)
Total Bilirubin: 0.9 mg/dL (ref 0.3–1.2)
Total Protein: 6.6 g/dL (ref 6.5–8.1)

## 2023-08-20 LAB — RAPID HIV SCREEN (HIV 1/2 AB+AG)
HIV 1/2 Antibodies: NONREACTIVE
HIV-1 P24 Antigen - HIV24: NONREACTIVE

## 2023-08-20 LAB — TYPE AND SCREEN
ABO/RH(D): O POS
Antibody Screen: NEGATIVE

## 2023-08-20 MED ORDER — OXYCODONE-ACETAMINOPHEN 5-325 MG PO TABS: 2.0000 | ORAL_TABLET | ORAL | Status: DC | PRN

## 2023-08-20 MED ORDER — MISOPROSTOL 50MCG HALF TABLET
50.0000 ug | ORAL_TABLET | Freq: Once | ORAL | Status: AC
  Administered 2023-08-20: 50 ug via ORAL
  Filled 2023-08-20: qty 1

## 2023-08-20 MED ORDER — ACETAMINOPHEN 325 MG PO TABS: 650.0000 mg | ORAL_TABLET | ORAL | Status: DC | PRN

## 2023-08-20 MED ORDER — FLUOXETINE HCL 20 MG PO CAPS
40.0000 mg | ORAL_CAPSULE | Freq: Every day | ORAL | Status: DC
  Filled 2023-08-20: qty 2

## 2023-08-20 MED ORDER — HYDROXYZINE HCL 25 MG PO TABS
25.0000 mg | ORAL_TABLET | Freq: Three times a day (TID) | ORAL | Status: DC | PRN
  Administered 2023-08-20: 25 mg via ORAL
  Filled 2023-08-20: qty 1

## 2023-08-20 MED ORDER — LACTATED RINGERS IV SOLN
500.0000 mL | Freq: Once | INTRAVENOUS | Status: AC
  Administered 2023-08-20: 500 mL via INTRAVENOUS

## 2023-08-20 MED ORDER — TERBUTALINE SULFATE 1 MG/ML IJ SOLN: 0.2500 mg | Freq: Once | INTRAMUSCULAR | Status: DC | PRN

## 2023-08-20 MED ORDER — MISOPROSTOL 25 MCG QUARTER TABLET: 25.0000 ug | ORAL_TABLET | ORAL | Status: DC | PRN

## 2023-08-20 MED ORDER — FENTANYL-BUPIVACAINE-NACL 0.5-0.125-0.9 MG/250ML-% EP SOLN
EPIDURAL | Status: DC | PRN
  Administered 2023-08-20: 12 mL/h via EPIDURAL

## 2023-08-20 MED ORDER — OXYTOCIN-SODIUM CHLORIDE 30-0.9 UT/500ML-% IV SOLN
2.5000 [IU]/h | INTRAVENOUS | Status: DC
  Administered 2023-08-21 (×2): 2.5 [IU]/h via INTRAVENOUS
  Filled 2023-08-20: qty 500

## 2023-08-20 MED ORDER — PHENYLEPHRINE 80 MCG/ML (10ML) SYRINGE FOR IV PUSH (FOR BLOOD PRESSURE SUPPORT): 80.0000 ug | PREFILLED_SYRINGE | INTRAVENOUS | Status: DC | PRN

## 2023-08-20 MED ORDER — SOD CITRATE-CITRIC ACID 500-334 MG/5ML PO SOLN
30.0000 mL | ORAL | Status: DC | PRN
  Administered 2023-08-20: 30 mL via ORAL
  Filled 2023-08-20: qty 30

## 2023-08-20 MED ORDER — LACTATED RINGERS IV SOLN: INTRAVENOUS | Status: DC

## 2023-08-20 MED ORDER — LACTATED RINGERS IV SOLN: 500.0000 mL | INTRAVENOUS | Status: DC | PRN

## 2023-08-20 MED ORDER — OXYCODONE-ACETAMINOPHEN 5-325 MG PO TABS: 1.0000 | ORAL_TABLET | ORAL | Status: DC | PRN

## 2023-08-20 MED ORDER — ONDANSETRON HCL 4 MG/2ML IJ SOLN
4.0000 mg | Freq: Four times a day (QID) | INTRAMUSCULAR | Status: DC | PRN
  Administered 2023-08-20 (×2): 4 mg via INTRAVENOUS
  Filled 2023-08-20 (×3): qty 2

## 2023-08-20 MED ORDER — OXYTOCIN BOLUS FROM INFUSION
333.0000 mL | Freq: Once | INTRAVENOUS | Status: AC
  Administered 2023-08-21: 333 mL via INTRAVENOUS

## 2023-08-20 MED ORDER — EPHEDRINE 5 MG/ML INJ: 10.0000 mg | INTRAVENOUS | Status: DC | PRN

## 2023-08-20 MED ORDER — OXYTOCIN-SODIUM CHLORIDE 30-0.9 UT/500ML-% IV SOLN
1.0000 m[IU]/min | INTRAVENOUS | Status: DC
  Administered 2023-08-20: 2 m[IU]/min via INTRAVENOUS
  Filled 2023-08-20: qty 500

## 2023-08-20 MED ORDER — ACETAMINOPHEN-CAFFEINE 500-65 MG PO TABS
2.0000 | ORAL_TABLET | Freq: Once | ORAL | Status: AC
  Administered 2023-08-20: 2 via ORAL
  Filled 2023-08-20: qty 2

## 2023-08-20 MED ORDER — BUSPIRONE HCL 5 MG PO TABS
10.0000 mg | ORAL_TABLET | Freq: Every day | ORAL | Status: DC
  Filled 2023-08-20: qty 1

## 2023-08-20 MED ORDER — FENTANYL-BUPIVACAINE-NACL 0.5-0.125-0.9 MG/250ML-% EP SOLN
12.0000 mL/h | EPIDURAL | Status: DC | PRN
  Filled 2023-08-20: qty 250

## 2023-08-20 MED ORDER — MISOPROSTOL 25 MCG QUARTER TABLET
25.0000 ug | ORAL_TABLET | Freq: Once | ORAL | Status: AC
  Administered 2023-08-20: 25 ug via VAGINAL
  Filled 2023-08-20: qty 1

## 2023-08-20 MED ORDER — LIDOCAINE HCL (PF) 1 % IJ SOLN: 30.0000 mL | INTRAMUSCULAR | Status: DC | PRN

## 2023-08-20 MED ORDER — LIDOCAINE HCL (PF) 1 % IJ SOLN
INTRAMUSCULAR | Status: DC | PRN
  Administered 2023-08-20: 5 mL via EPIDURAL

## 2023-08-20 MED ORDER — DIPHENHYDRAMINE HCL 50 MG/ML IJ SOLN
12.5000 mg | INTRAMUSCULAR | Status: DC | PRN
  Administered 2023-08-21: 12.5 mg via INTRAVENOUS
  Filled 2023-08-20: qty 1

## 2023-08-20 NOTE — H&P (Addendum)
OBSTETRIC ADMISSION HISTORY AND PHYSICAL  Karen Crawford is a 22 y.o. female G2P0010 with IUP at [redacted]w[redacted]d by LMP presenting for IOL for gHTN. She reports +Fms and HA. No LOF, no VB, no changes in vision, or peripheral edema, and RUQ pain.  She plans on breast feeding. She requests Slynd for birth control.  She received her prenatal care at [facility].  Dating: By LMP --->  Estimated Date of Delivery: 09/10/23  Sono:    @[redacted]w[redacted]d , CWD, normal anatomy, cephalic presentation, posterior placental lie, 533g, 46% EFW @[redacted]w[redacted]d , cephalic presentation, posterior placental lie, 2579g, EFW 14%   Prenatal History/Complications:  - cHTN with superimposed gHTN - Anxiety (on buspar, prozac, vistaril) - Food insecurity  Past Medical History: Past Medical History:  Diagnosis Date   Depression    Hypertension    Mental disorder    anxiety and depression   Skin mole 05/06/2023   Supervision of other normal pregnancy, antepartum 04/08/2023              NURSING     PROVIDER      Office Location    [place]    Dating by    U/S at 13 wks      [facility] Model    Traditional    Anatomy U/S    normal      Initiated care at     [employer]     English                     LAB RESULTS       Support Person    Karen Crawford    Genetics    NIPS:  LR female        AFP:     neg                       NT/IT (FT only)                       Past Surgical History: Past Surgical History:  Procedure Laterality Date   EYE SURGERY     STRABISMUS SURGERY      Obstetrical History: OB History     Gravida  2   Para      Term      Preterm      AB  1   Living         SAB      IAB  1   Ectopic      Multiple      Live Births              Social History Social History   Socioeconomic History   Marital status: Married    Spouse name: Karen Crawford   Number of children: Not on file   Years of education: Not on file   Highest education level: Not on file  Occupational History   Not on  file  Tobacco Use   Smoking status: Never   Smokeless tobacco: Not on file  Vaping Use   Vaping status: Some Days   Substances: Nicotine, Flavoring  Substance and Sexual Activity   Alcohol use: No   Drug use: Not Currently   Sexual activity: Yes  Other Topics Concern   Not on file  Social History  Narrative   Not on file   Social Determinants of Health   Financial Resource Strain: Medium Risk (02/02/2023)   Received from [facility], [facility]   Overall Financial Resource Strain (CARDIA)    Difficulty of Paying Living Expenses: Somewhat hard  Food Insecurity: No Food Insecurity (08/20/2023)   Hunger Vital Sign    Worried About Running Out of Food in the Last Year: Never true    Ran Out of Food in the Last Year: Never true  Transportation Needs: No Transportation Needs (08/20/2023)   PRAPARE - Administrator, Civil Service (Medical): No    Lack of Transportation (Non-Medical): No  Physical Activity: Not on file  Stress: Not on file  Social Connections: Unknown (05/14/2022)   Received from [facility], [facility]   Social Network    Social Network: Not on file    Family History: Family History  Problem Relation Age of Onset   Hypertension Mother    Hypertension Maternal Grandfather    Diabetes Maternal Grandfather    Cancer Neg Hx     Allergies: Allergies  Allergen Reactions   Sulfa Antibiotics Hives and Swelling   Pollen Extract Other (See Comments)    Congestion, runny nose    Medications Prior to Admission  Medication Sig Dispense Refill Last Dose   acetaminophen (TYLENOL) 500 MG tablet Take 1 tablet (500 mg total) by mouth every 6 (six) hours as needed. 30 tablet 0 Past Week   aspirin EC 81 MG tablet Take 1 tablet (81 mg total) by mouth daily. Take after 12 weeks for prevention of preeclampsia later in pregnancy 300 tablet 2 08/19/2023   aspirin-acetaminophen-caffeine (EXCEDRIN MIGRAINE) 250-250-65 MG tablet Take by mouth every 6 (six)  hours as needed for headache.   08/19/2023   busPIRone (BUSPAR) 10 MG tablet Take 10 mg by mouth daily.   08/19/2023   diphenhydrAMINE (BENADRYL) 50 MG capsule Take 50 mg by mouth every 6 (six) hours as needed for sleep.   08/19/2023   famotidine (PEPCID) 20 MG tablet Take 20 mg by mouth 2 (two) times daily.   08/19/2023   FLUoxetine (PROZAC) 40 MG capsule Take 40 mg by mouth daily.   08/19/2023   hydrOXYzine (VISTARIL) 25 MG capsule Take 1 capsule (25 mg total) by mouth 3 (three) times daily as needed for anxiety. 30 capsule 0 Past Week   Prenatal Vit-Fe Fumarate-FA (PRENATAL MULTIVITAMIN) TABS tablet Take 1 tablet by mouth daily at 12 noon.   08/19/2023   labetalol (NORMODYNE) 100 MG tablet Take 1 tablet (100 mg total) by mouth 2 (two) times daily. (Patient not taking: Reported on 08/19/2023) 180 tablet 3    Magnesium 250 MG TABS Take 1 tablet by mouth daily.      pyridOXINE (VITAMIN B6) 100 MG tablet Take 100 mg by mouth daily.      vitamin B-12 (CYANOCOBALAMIN) 100 MCG tablet Take 500 mcg by mouth daily.        Review of Systems   All systems reviewed and negative except as stated in HPI  Last menstrual period 12/04/2022. General appearance: alert, cooperative, and no distress Lungs: normal respiratory effort on room air Heart: regular rate Extremities: No calf swelling or tenderness Presentation: cephalic Fetal monitoring: Baseline: 130 bpm, Variability: Good {> 6 bpm), Accelerations: present, and Decelerations: Absent Uterine activity: irritable Dilation: 1.5 Effacement (%): Thick Exam by:: MJ Laural Benes, RNc-OB  Prenatal labs: ABO, Rh: --/--/O POS (09/26 1428) Antibody: NEG (09/26 1428) Rubella:  Immune RPR: Non Reactive (07/10 0848)  HBsAg:    HIV: NON REACTIVE (09/26 1428)  GBS: Negative/-- (09/17 1157)  2hr GTT: passed Genetic screening:  AFP negative; Horizon CTFR negative; NIPS LR female Anatomy US: normal  Prenatal Transfer Tool  Maternal Diabetes: No Genetic Screening:  Normal Maternal Ultrasounds/Referrals: Normal Fetal Ultrasounds or other Referrals:  None Maternal Substance Abuse:  No Significant Maternal Medications: Prozac, Buspar, Vistaril, labetalol Significant Maternal Lab Results: Group B Strep negative Number of Prenatal Visits:greater than 3 verified prenatal visits Other Comments:  None  Results for orders placed or performed during the hospital encounter of 08/20/23 (from the past 24 hour(s))  CBC   Collection Time: 08/20/23  2:28 PM  Result Value Ref Range   WBC 11.6 (H) 4.0 - 10.5 K/uL   RBC 4.30 3.87 - 5.11 MIL/uL   Hemoglobin 12.9 12.0 - 15.0 g/dL   HCT 68.3 41.9 - 62.2 %   MCV 87.2 80.0 - 100.0 fL   MCH 30.0 26.0 - 34.0 pg   MCHC 34.4 30.0 - 36.0 g/dL   RDW 29.7 98.9 - 21.1 %   Platelets 303 150 - 400 K/uL   nRBC 0.0 0.0 - 0.2 %  Rapid HIV screen (HIV 1/2 Ab+Ag)   Collection Time: 08/20/23  2:28 PM  Result Value Ref Range   HIV-1 P24 Antigen - HIV24 NON REACTIVE NON REACTIVE   HIV 1/2 Antibodies NON REACTIVE NON REACTIVE   Interpretation (HIV Ag Ab)      A non reactive test result means that HIV 1 or HIV 2 antibodies and HIV 1 p24 antigen were not detected in the specimen.  Type and screen [facility]   Collection Time: 08/20/23  2:28 PM  Result Value Ref Range   ABO/RH(D) O POS    Antibody Screen NEG    Sample Expiration      08/23/2023,2359 Performed at [facility], [address], [place], [place] [address]     Patient Active Problem List   Diagnosis Date Noted   Chronic hypertension in obstetric context in third trimester 08/20/2023   Gestational hypertension 08/20/2023   Supervision of other normal pregnancy, antepartum 04/08/2023   Anxiety 04/08/2023   Primary hypertension 04/08/2023   Food insecurity 02/02/2023    Assessment/Plan:  KAMARIANA CABRAL is a 22 y.o. G2P0010 at [redacted]w[redacted]d here for IOL for gHTN.  #Labor: FB placed at outpatient visit. Will also give PO Cytotec to help with  cervical ripening. Anticipate SVD #Pain: IV fentanyl, epidural #FWB: Category I tracing #ID: GBS negative #MOF: breast #MOC: Slynd #Circ: Yes, consent needed  #cHTN: Bps 130s/90s at this time. Will continue to monitor BP and treat as needed. CBC, CMP, PCR ordered. Reporting HA -- unchanged, "specks" in vision -- also unchanged from previous -- w/up at that time was neg for proteinuria. #Anxiety: Will continue home meds (Vistaril, Prozac, Buspar) while hospitalized #Food insecurity: TOC consult  Gillermina Phy, MS3  Attestation of Supervision of Student:  I confirm that I have verified the information documented in the medical student's note and that I have also personally performed the history, physical exam and all medical decision making activities.  I have verified that all services and findings are accurately documented in this student's note; and I agree with management and plan as outlined in the documentation. I have also made any necessary editorial changes.  Sundra Aland, MD Center for [facility], [facility] 08/20/2023 3:58 PM

## 2023-08-20 NOTE — Progress Notes (Signed)
   PRENATAL VISIT NOTE  Subjective:  Karen Crawford is a 22 y.o. G2P0010 at [redacted]w[redacted]d being seen today for ongoing prenatal care.  She is currently monitored for the following issues for this high-risk pregnancy and has Anxiety; Primary hypertension; and Food insecurity on their problem list.  Patient reports  BP elevated at home. Was seen at cardiology office yesterday, BP elevated to the 150s at home.  .  Contractions: Irregular. Vag. Bleeding: None.  Movement: Present. Denies leaking of fluid.   The following portions of the patient's history were reviewed and updated as appropriate: allergies, current medications, past family history, past medical history, past social history, past surgical history and problem list.   Objective:   Vitals:   08/20/23 1053 08/20/23 1054  BP: (!) 132/92 (!) 131/90  Pulse: 96 (!) 121  Weight: 148 lb (67.1 kg)     Fetal Status:     Movement: Present     General:  Alert, oriented and cooperative. Patient is in no acute distress.  Skin: Skin is warm and dry. No rash noted.   Cardiovascular: Normal heart rate noted  Respiratory: Normal respiratory effort, no problems with respiration noted  Abdomen: Soft, gravid, appropriate for gestational age.  Pain/Pressure: Present     Pelvic: Cervical exam deferred        Extremities: Normal range of motion.  Edema: None  Mental Status: Normal mood and affect. Normal behavior. Normal judgment and thought content.   Assessment and Plan:  Pregnancy: G2P0010 at [redacted]w[redacted]d 1. [redacted] weeks gestation of pregnancy  2. Supervision of other normal pregnancy, antepartum FHT and FH normal  3. Chronic hypertension in obstetric context in third trimester Superimposed GHTN - BP very well controlled up to this point. Will send to the hospital. 10 min FHT done in office - reactive Foley balloon placed with 40mL of fluid. NST following - reactive   Term labor symptoms and general obstetric precautions including but not limited to  vaginal bleeding, contractions, leaking of fluid and fetal movement were reviewed in detail with the patient. Please refer to After Visit Summary for other counseling recommendations.   No follow-ups on file.  Future Appointments  Date Time Provider Department Center  08/26/2023  9:55 AM Levie Heritage, DO [facility] None  08/28/2023  9:20 AM Tobb, Lavona Mound, DO [facility] None  09/02/2023  8:55 AM Marny Lowenstein, PA-C [facility] None  09/07/2023  9:35 AM Adam Phenix, MD [facility] None    Levie Heritage, DO

## 2023-08-20 NOTE — Anesthesia Preprocedure Evaluation (Signed)
Anesthesia Evaluation  Patient identified by MRN, date of birth, ID band Patient awake    Reviewed: Allergy & Precautions, NPO status , Patient's Chart, lab work & pertinent test results  Airway Mallampati: II  TM Distance: >3 FB Neck ROM: Full    Dental no notable dental hx. (+) Teeth Intact, Dental Advisory Given   Pulmonary neg pulmonary ROS   Pulmonary exam normal breath sounds clear to auscultation       Cardiovascular hypertension, Normal cardiovascular exam Rhythm:Regular Rate:Normal     Neuro/Psych negative neurological ROS     GI/Hepatic negative GI ROS, Neg liver ROS,,,  Endo/Other    Renal/GU      Musculoskeletal   Abdominal   Peds  Hematology Lab Results      Component                Value               Date                      WBC                      11.6 (H)            08/20/2023                HGB                      12.9                08/20/2023                HCT                      37.5                08/20/2023                MCV                      87.2                08/20/2023                PLT                      303                 08/20/2023              Anesthesia Other Findings All Sulfa  Reproductive/Obstetrics (+) Pregnancy                              Anesthesia Physical Anesthesia Plan  ASA: 3  Anesthesia Plan: Epidural   Post-op Pain Management:    Induction:   PONV Risk Score and Plan:   Airway Management Planned:   Additional Equipment:   Intra-op Plan:   Post-operative Plan:   Informed Consent: I have reviewed the patients History and Physical, chart, labs and discussed the procedure including the risks, benefits and alternatives for the proposed anesthesia with the patient or authorized representative who has indicated his/her understanding and acceptance.       Plan Discussed with:   Anesthesia Plan Comments: (37 wk G2P0  w Ghtn for LEA)  Anesthesia Quick Evaluation

## 2023-08-20 NOTE — Anesthesia Procedure Notes (Signed)
Epidural Patient location during procedure: OB Start time: 08/20/2023 8:44 PM End time: 08/20/2023 8:56 PM  Staffing Anesthesiologist: Trevor Iha, MD Performed: anesthesiologist   Preanesthetic Checklist Completed: patient identified, IV checked, site marked, risks and benefits discussed, surgical consent, monitors and equipment checked, pre-op evaluation and timeout performed  Epidural Patient position: sitting Prep: DuraPrep and site prepped and draped Patient monitoring: continuous pulse ox and blood pressure Approach: midline Location: L3-L4 Injection technique: LOR air  Needle:  Needle type: Tuohy  Needle gauge: 17 G Needle length: 9 cm and 9 Needle insertion depth: 5 cm Catheter type: closed end flexible Catheter size: 19 Gauge Catheter at skin depth: 10 cm Test dose: negative  Assessment Events: blood not aspirated, no cerebrospinal fluid, injection not painful, no injection resistance, no paresthesia and negative IV test  Additional Notes Patient identified. Risks/Benefits/Options discussed with patient including but not limited to bleeding, infection, nerve damage, paralysis, failed block, incomplete pain control, headache, blood pressure changes, nausea, vomiting, reactions to medication both or allergic, itching and postpartum back pain. Confirmed with bedside nurse the patient's most recent platelet count. Confirmed with patient that they are not currently taking any anticoagulation, have any bleeding history or any family history of bleeding disorders. Patient expressed understanding and wished to proceed. All questions were answered. Sterile technique was used throughout the entire procedure. Please see nursing notes for vital signs. Test dose was given through epidural needle and negative prior to continuing to dose epidural or start infusion. Warning signs of high block given to the patient including shortness of breath, tingling/numbness in hands, complete  motor block, or any concerning symptoms with instructions to call for help. Patient was given instructions on fall risk and not to get out of bed. All questions and concerns addressed with instructions to call with any issues.1   Attempt (S) . Patient tolerated procedure well.

## 2023-08-20 NOTE — Progress Notes (Signed)
Circumcision Consent   Discussed with mom at bedside about circumcision.    Circumcision is a surgery that removes the skin that covers the tip of the penis, called the "foreskin." Circumcision is usually done when a boy is between 73 and 77 days old, sometimes up to 20-31 weeks old.   The most common reasons boys are circumcised include for cultural/religious beliefs or for parental preference (potentially easier to clean, so baby looks like daddy, etc).   There may be some medical benefits for circumcision:    Circumcised boys seem to have slightly lower rates of: ? Urinary tract infections (per the American Academy of Pediatrics an uncircumcised boy has a 1/100 chance of developing a UTI in the first year of life, a circumcised boy at a 11/998 chance of developing a UTI in the first year of life- a 10% reduction) ? Penis cancer (typically rare- an uncircumcised female has a 1 in 100,000 chance of developing cancer of the penis) ? Sexually transmitted infection (in endemic areas, including HIV, HPV and Herpes- circumcision does NOT protect against gonorrhea, chlamydia, trachomatis, or syphilis) ? Phimosis: a condition where that makes retraction of the foreskin over the glans impossible (0.4 per 1000 boys per year or 0.6% of boys are affected by their 15th birthday)   Boys and men who are not circumcised can reduce these extra risks by: ? Cleaning their penis well ? Using condoms during sex   What are the risks of circumcision?   As with any surgical procedure, there are risks and complications. In circumcision, complications are rare and usually minor, the most common being: ? Bleeding- risk is reduced by holding each clamp for 30 seconds prior to a cut being made, and by holding pressure after the procedure is done ? Infection- the penis is cleaned prior to the procedure, and the procedure is done under sterile technique ? Damage to the urethra or amputation of the penis   How is  circumcision done in baby boys?   The baby will be placed on a special table and the legs restrained for their safety. Numbing medication is injected into the penis, and the skin is cleansed with betadine to decrease the risk of infection.    What to expect:   The penis will look red and raw for 5-7 days as it heals. We expect scabbing around where the cut was made, as well as clear-pink fluid and some swelling of the penis right after the procedure. If your baby's circumcision starts to bleed or develops pus, please contact your pediatrician immediately.   All questions were answered and mother consented.     Sundra Aland, MD FMOB Fellow, Faculty practice [facility], Center for [facility] 08/20/23, 7:56 PM

## 2023-08-20 NOTE — Telephone Encounter (Signed)
Called patient. She is on her way to hospital now.

## 2023-08-20 NOTE — Progress Notes (Signed)
Pt feeling contractions every 5-6 minutes.   Blood pressure (!) 134/106, pulse (!) 109, temperature 98.1 F (36.7 C), temperature source Oral, resp. rate 16, last menstrual period 12/04/2022.  FHT: 120, mod var + accels Toco Q1-6 CE 4/70/-1, AROM to clear fluid.   Plan:  FHT category 1 Will continue IOL with pitocin.  AROM done with pt consent.  Epidural when desired.  Bps mild range Admission labs wnl for PreE.   Karen Hock, MD Attending Obstetrician & Gynecologist, [facility] for [facility], [facility]

## 2023-08-21 ENCOUNTER — Other Ambulatory Visit: Payer: Self-pay

## 2023-08-21 ENCOUNTER — Encounter (HOSPITAL_COMMUNITY): Payer: Self-pay | Admitting: Family Medicine

## 2023-08-21 DIAGNOSIS — O99344 Other mental disorders complicating childbirth: Secondary | ICD-10-CM

## 2023-08-21 DIAGNOSIS — Z3A37 37 weeks gestation of pregnancy: Secondary | ICD-10-CM | POA: Diagnosis not present

## 2023-08-21 DIAGNOSIS — D229 Melanocytic nevi, unspecified: Secondary | ICD-10-CM | POA: Diagnosis not present

## 2023-08-21 DIAGNOSIS — O134 Gestational [pregnancy-induced] hypertension without significant proteinuria, complicating childbirth: Secondary | ICD-10-CM

## 2023-08-21 LAB — RPR: RPR Ser Ql: NONREACTIVE

## 2023-08-21 MED ORDER — DIBUCAINE (PERIANAL) 1 % EX OINT: 1.0000 | TOPICAL_OINTMENT | CUTANEOUS | Status: DC | PRN

## 2023-08-21 MED ORDER — ONDANSETRON HCL 4 MG PO TABS: 4.0000 mg | ORAL_TABLET | ORAL | Status: DC | PRN

## 2023-08-21 MED ORDER — COCONUT OIL OIL: 1.0000 | TOPICAL_OIL | Status: DC | PRN

## 2023-08-21 MED ORDER — HYDROXYZINE HCL 25 MG PO TABS: 25.0000 mg | ORAL_TABLET | Freq: Three times a day (TID) | ORAL | Status: DC | PRN

## 2023-08-21 MED ORDER — WITCH HAZEL-GLYCERIN EX PADS: 1.0000 | MEDICATED_PAD | CUTANEOUS | Status: DC | PRN

## 2023-08-21 MED ORDER — SIMETHICONE 80 MG PO CHEW: 80.0000 mg | CHEWABLE_TABLET | ORAL | Status: DC | PRN

## 2023-08-21 MED ORDER — ZOLPIDEM TARTRATE 5 MG PO TABS: 5.0000 mg | ORAL_TABLET | Freq: Every evening | ORAL | Status: DC | PRN

## 2023-08-21 MED ORDER — BENZOCAINE-MENTHOL 20-0.5 % EX AERO
1.0000 | INHALATION_SPRAY | CUTANEOUS | Status: DC | PRN
  Administered 2023-08-21: 1 via TOPICAL
  Filled 2023-08-21: qty 56

## 2023-08-21 MED ORDER — SENNOSIDES-DOCUSATE SODIUM 8.6-50 MG PO TABS
2.0000 | ORAL_TABLET | Freq: Every day | ORAL | Status: DC
  Administered 2023-08-22 – 2023-08-23 (×2): 2 via ORAL
  Filled 2023-08-21 (×2): qty 2

## 2023-08-21 MED ORDER — FLUOXETINE HCL 20 MG PO CAPS
40.0000 mg | ORAL_CAPSULE | Freq: Every day | ORAL | Status: DC
  Administered 2023-08-21 – 2023-08-23 (×3): 40 mg via ORAL
  Filled 2023-08-21 (×2): qty 2

## 2023-08-21 MED ORDER — ONDANSETRON HCL 4 MG/2ML IJ SOLN: 4.0000 mg | INTRAMUSCULAR | Status: DC | PRN

## 2023-08-21 MED ORDER — BUSPIRONE HCL 5 MG PO TABS
10.0000 mg | ORAL_TABLET | Freq: Every day | ORAL | Status: DC
  Administered 2023-08-21 – 2023-08-23 (×3): 10 mg via ORAL
  Filled 2023-08-21 (×2): qty 2

## 2023-08-21 MED ORDER — PRENATAL MULTIVITAMIN CH
1.0000 | ORAL_TABLET | Freq: Every day | ORAL | Status: DC
  Administered 2023-08-21 – 2023-08-23 (×3): 1 via ORAL
  Filled 2023-08-21 (×3): qty 1

## 2023-08-21 MED ORDER — DIPHENHYDRAMINE HCL 25 MG PO CAPS: 25.0000 mg | ORAL_CAPSULE | Freq: Four times a day (QID) | ORAL | Status: DC | PRN

## 2023-08-21 MED ORDER — IBUPROFEN 600 MG PO TABS
600.0000 mg | ORAL_TABLET | Freq: Four times a day (QID) | ORAL | Status: DC
  Administered 2023-08-21 – 2023-08-23 (×9): 600 mg via ORAL
  Filled 2023-08-21 (×10): qty 1

## 2023-08-21 MED ORDER — ACETAMINOPHEN 325 MG PO TABS
650.0000 mg | ORAL_TABLET | ORAL | Status: DC | PRN
  Administered 2023-08-21: 650 mg via ORAL
  Filled 2023-08-21: qty 2

## 2023-08-21 MED ORDER — OXYCODONE HCL 5 MG PO TABS: 10.0000 mg | ORAL_TABLET | ORAL | Status: DC | PRN

## 2023-08-21 MED ORDER — TETANUS-DIPHTH-ACELL PERTUSSIS 5-2.5-18.5 LF-MCG/0.5 IM SUSY: 0.5000 mL | PREFILLED_SYRINGE | Freq: Once | INTRAMUSCULAR | Status: DC

## 2023-08-21 MED ORDER — OXYCODONE HCL 5 MG PO TABS: 5.0000 mg | ORAL_TABLET | ORAL | Status: DC | PRN

## 2023-08-21 NOTE — Lactation Note (Signed)
This note was copied from a baby's chart. Lactation Consultation Note  Patient Name: Karen Crawford [id number] Date: 08/21/2023 Age:23 hours Reason for consult: Initial assessment P1, per mom baby fed in L/D and I;ve been hand expressing at home with colostrum in the freezer.  LC offered to assist, mom receptive and changed a small to mod size soft black stool.  Baby wide awake rooting. LC assisted mom to latch on the right breast, football and after several attempts baby latched with score of 8 with depth. Per mom comfortable the entire feeding.  Per mom has been about 40 syringes of colostrum at home.  LC recommended only bringing in 10 for supplementing.  Per mom has been fitted for #15 F , LC informed the hospital only has 18's . LC showed mom how to use the hand pump and nurse will review settings for the DEBP. LC set it up and was unable to have mom pump at this time due to baby feeding.  LC plan :  Feed with feeding cues and by 3 hours ( 8-12 times a day )  Due to areola edema - steps for latching and shells between feedings.  For now since baby is latching so well with multiple swallows there time to get the DEBP set up.  No need to supplement at this point. Mom has plans to have a family member bring her frozen colostrum from home.    Maternal Data Has patient been taught Hand Expression?: Yes Does the patient have breastfeeding experience prior to this delivery?: No  Feeding Mother's Current Feeding Choice: Breast Milk  LATCH Score Latch: Repeated attempts needed to sustain latch, nipple held in mouth throughout feeding, stimulation needed to elicit sucking reflex.  Audible Swallowing: Spontaneous and intermittent  Type of Nipple: Everted at rest and after stimulation (areola edema)  Comfort (Breast/Nipple): Soft / non-tender  Hold (Positioning): Assistance needed to correctly position infant at breast and maintain latch.  LATCH Score: 8   Lactation Tools  Discussed/Used  DEBP , Hand pump #18 F and shells   Interventions Interventions: Breast feeding basics reviewed;Assisted with latch;Skin to skin;Breast massage;Hand express;Pre-pump if needed;Reverse pressure;Breast compression;Adjust position;Support pillows;Position options;Shells;Hand pump;DEBP;Education;LC Services brochure  Discharge Pump: Hands Free;Manual;Personal WIC Program: No  Consult Status Consult Status: Follow-up Date: 08/22/23 Follow-up type: In-patient    Matilde Sprang Johsua Shevlin 08/21/2023, 8:40 AM

## 2023-08-21 NOTE — Plan of Care (Signed)

## 2023-08-21 NOTE — Progress Notes (Signed)
S: Pt comfortable with epidural.   O: Blood pressure 91/72, pulse 88, temperature 98.1 F (36.7 C), temperature source Oral, resp. rate 16, last menstrual period 12/04/2022, SpO2 100%.   FHT: 130, mod var + accels, no decels Toco Q2 min CE C/C/+2   A/P:  Pushing with excellent maternal effort. Head at perineum. Anticipate SVD.   Milas Hock, MD Attending Obstetrician & Gynecologist, [facility] for [facility], [facility]

## 2023-08-21 NOTE — Discharge Summary (Signed)
Postpartum Discharge Summary  Date of Service updated***     Patient Name: Karen Crawford DOB: 09-26-2001 MRN: [id number]  Date of admission: 08/20/2023 Delivery date:08/21/2023 Delivering provider: Milas Hock Date of discharge: 08/21/2023  Admitting diagnosis: Gestational hypertension [O13.9] Intrauterine pregnancy: [redacted]w[redacted]d     Secondary diagnosis:  Principal Problem:   Gestational hypertension  Additional problems: ***    Discharge diagnosis: {DX.:23714}                                              Post partum procedures:{Postpartum procedures:23558} Augmentation: AROM, Pitocin, Cytotec, and OP Foley Complications: None  Hospital course: Induction of Labor With Vaginal Delivery   22 y.o. yo G2P0010 at [redacted]w[redacted]d was admitted to the hospital 08/20/2023 for induction of labor.  Indication for induction:  chronic hypertension .  Patient had an labor course complicated by*** Membrane Rupture Time/Date: 7:23 PM,08/20/2023  Delivery Method:Vaginal, Spontaneous Operative Delivery:N/A Episiotomy: None Lacerations:  None Details of delivery can be found in separate delivery note.  Patient had a postpartum course complicated by***. Patient is discharged home 08/21/23.  Newborn Data: Birth date:08/21/2023 Birth time:2:23 AM Gender:Female Living status:Living Apgars:8 ,9  Weight:   Magnesium Sulfate received: No BMZ received: No Rhophylac:N/A MMR:N/A T-DaP:{Tdap:23962} Flu: {YQM:57846} RSV Vaccine received: {RSV:31013} Transfusion:{Transfusion received:30440034}  Immunizations received:  There is no immunization history on file for this patient.  Physical exam  Vitals:   08/21/23 0200 08/21/23 0203 08/21/23 0230 08/21/23 0247  BP: 133/82 133/82 137/79 (!) 123/95  Pulse:   (!) 107 (!) 106  Resp:      Temp:      TempSrc:      SpO2:       General: {Exam; general:21111117} Lochia: {Desc; appropriate/inappropriate:30686::"appropriate"} Uterine Fundus: {Desc;  firm/soft:30687} Incision: {Exam; incision:21111123} DVT Evaluation: {Exam; dvt:2111122} Labs: Lab Results  Component Value Date   WBC 11.6 (H) 08/20/2023   HGB 12.9 08/20/2023   HCT 37.5 08/20/2023   MCV 87.2 08/20/2023   PLT 303 08/20/2023      Latest Ref Rng & Units 08/20/2023    2:28 PM  CMP  Glucose 70 - 99 mg/dL 962   BUN 6 - 20 mg/dL 8   Creatinine 9.52 - 8.41 mg/dL 3.24   Sodium 401 - 027 mmol/L 135   Potassium 3.5 - 5.1 mmol/L 3.8   Chloride 98 - 111 mmol/L 105   CO2 22 - 32 mmol/L 17   Calcium 8.9 - 10.3 mg/dL 9.1   Total Protein 6.5 - 8.1 g/dL 6.6   Total Bilirubin 0.3 - 1.2 mg/dL 0.9   Alkaline Phos 38 - 126 U/L 216   AST 15 - 41 U/L 19   ALT 0 - 44 U/L 20    Edinburgh Score:     No data to display         No data recorded  After visit meds:  Allergies as of 08/21/2023       Reactions   Sulfa Antibiotics Hives, Swelling   Pollen Extract Other (See Comments)   Congestion, runny nose     Med Rec must be completed prior to using this [facility]***        Discharge home in stable condition Infant Feeding: Breast Infant Disposition:{CHL IP OB HOME WITH OZDGUY:40347} Discharge instruction: per After Visit Summary and Postpartum booklet. Activity: Advance as tolerated. Pelvic rest  for 6 weeks.  Diet: routine diet Future Appointments: Future Appointments  Date Time Provider Department Center  08/28/2023  9:20 AM Thomasene Ripple, DO [facility] None  08/28/2023 10:25 AM [facility] NURSE [facility] None  10/01/2023  1:30 PM Levie Heritage, DO [facility] None   Follow up Visit:  Follow-up Information     [facility] Follow up in 6 week(s).   Specialty: Obstetrics and Gynecology Contact information: [address] [place] [address] [phone]               Message sent to [facility] for postpartum appt by Dr. Para March on 9/27.    Please schedule this patient for a In  person postpartum visit in 6 weeks with the following provider: Any provider. Additional Postpartum F/U:Incision check next week  and 1 week BP check High risk pregnancy complicated by: HTN Delivery mode:  Vaginal, Spontaneous Anticipated Birth Control:  POPs   08/21/2023 Milas Hock, MD

## 2023-08-21 NOTE — Procedures (Addendum)
Patient requested removal of mole on her mons pubis due to cutting it shaving. She requested removal prior to delivery but to be done after delivery of the baby. Informed consent reviewed and signed.    Procedure Note: Mole was on the mons pubis and about 8 mm in diameter. Benign in appearance. Skin tested around the mole and numb from epidural. Betadine used to sterilize the area. Elliptical incision made around the mole with 15 blade scalpel. Mole sent for pathology. Skin closed with 4-0 vicryl in a subcuticular fashion. Incision covered with gauze and Tegaderm.   Care instructions reviewed with pt. She tolerated the procedure well.   Milas Hock, MD Attending Obstetrician & Gynecologist, [facility] for [facility], [facility]

## 2023-08-21 NOTE — Progress Notes (Signed)
S: Pt comfortable with epidural.   O: Blood pressure 119/63, pulse (!) 111, temperature 98.4 F (36.9 C), temperature source Oral, resp. rate 16, last menstrual period 12/04/2022, SpO2 100%.  FHT: 130, mod var + accels, no decels Toco Q2 min CE 4.5/70/-2 Pit at 6  A/P:  Continue pitocin titration. FHT category 1.  Bps normal-mild range.  Anticipate SVD.   Milas Hock, MD Attending Obstetrician & Gynecologist, [facility] for [facility], [facility]

## 2023-08-22 NOTE — Social Work (Signed)
CSW received consult for hx of Anxiety, EPDS score of 11 and food insecurity.  CSW met with MOB to offer support and complete assessment.  CSW entered the room and observed MOB resting in bed, FOB at bedside and the infant was not present as he was having a circumcision procedure. CSW introduced self, CSW role and reason for visit. MOB was agreeable to visit and allowed FOB to remain in the room. CSW inquired about ho MOB was feeling, MOB reported good. 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 MOB reported she currently takes Buspar and Prozac. MOB reported the medication has been beneficial for her. CSW inquired about MOB EPDS score, MOB reported she was anxious about baby boy's arrival. CSW assessed for safety, MOB denied nay SI or HI. CSW provided education regarding the baby blues period vs. perinatal mood disorders, discussed treatment and gave resources for mental health follow up if concerns arise.  CSW recommends self-evaluation during the postpartum time period using the New Mom Checklist from Postpartum Progress and encouraged MOB to contact a medical professional if symptoms are noted at any time.  MOB identified her spouse and her mom as her supports.  CSW provided review of Sudden Infant Death Syndrome (SIDS) precautions.  MOB identified [facility] for infants  follow up care. MOB reported they have all necessary items for the infant including a bassinet and car seat. CSW inquired about noted food insecurity, MOB reported she is worried about formula, CSW explained the steps to applying for [facility] and advised MOB to also apply for Food stamps, MOB agreed.  CSW identifies no further need for intervention and no barriers to discharge at this time.  Wende Neighbors, LCSWA Clinical Social Worker [phone]

## 2023-08-22 NOTE — Lactation Note (Addendum)
This note was copied from a baby's chart. Lactation Consultation Note  Patient Name: Karen Crawford ACZYS'A Date: 08/22/2023 Age:23 hours Reason for consult: Follow-up assessment;1st time breastfeeding;Early term 37-38.6wks.  P1, ETI female infant with -5.49% weight loss, infant is breastfeeding and being supplemented with EBM/Formula.   CO: Birth Parent is not using the DEBP she needs smaller flange size 15 mm which is not available in hospital..  LC suggested when not latching infant at breast to hand express for breast stimulation and give infant back any EBM first and then offer formula due to not using the DEBP to help establish her milk supply.  Per Birth Parent, Infant not had a void or stool today and was circumcised around noon today has been sleepy and now is waking up for feedings.LC suggested pillow support to help with  latch and  bring infant in alignment with breast ( infant's chest to breast), infant latched with depth and was still breastfeeding after 10 minutes when LC left the room.  Current feeding plan: 1- Continue to BF infant with cues, on demand, every 2-3 hours, skin to skin. 2- Will supplement infant with any EBM first from hand expression and then offer formula. Birth Parent has handout "Feeding Amounts". Day 2 after infant latches will offer (7-12 mls), if no latch will offer 15-30 mls or more if infant wants if, if supplementing with formula only, Birth Parent will do hand expression to help stimulate her  milk supply due to supplementation of formula and not using the DEBP.  3- Birth Parent will call RN/LC if their are any questions, concerns or need latch assistance.    Maternal Data    Feeding Mother's Current Feeding Choice: Breast Milk and Formula  LATCH Score Latch: Grasps breast easily, tongue down, lips flanged, rhythmical sucking.  Audible Swallowing: A few with stimulation  Type of Nipple: Everted at rest and after stimulation  Comfort  (Breast/Nipple): Soft / non-tender  Hold (Positioning): No assistance needed to correctly position infant at breast.  LATCH Score: 9   Lactation Tools Discussed/Used    Interventions    Discharge Pump: DEBP;Personal  Consult Status Consult Status: Follow-up Date: 08/23/23 Follow-up type: In-patient    Frederico Hamman 08/22/2023, 4:04 PM

## 2023-08-22 NOTE — Progress Notes (Addendum)
POSTPARTUM PROGRESS NOTE  Post Partum Day 1  Subjective:  Karen Crawford is a 22 y.o. G2P1011 s/p SVD at [redacted]w[redacted]d.  She reports she is doing well. No acute events overnight. She denies any problems with ambulating, voiding or po intake. Denies nausea or vomiting.  Pain is moderately controlled.  Lochia is appropriate.  Objective: Blood pressure 124/81, pulse 63, temperature 97.9 F (36.6 C), temperature source Oral, resp. rate 18, last menstrual period 12/04/2022, SpO2 98%, unknown if currently breastfeeding.  Physical Exam:  General: alert, cooperative and no distress Chest: no respiratory distress Heart:regular rate, distal pulses intact Uterine Fundus: firm, appropriately tender DVT Evaluation: No calf swelling or tenderness Extremities: no edema Skin: warm, dry  Recent Labs    08/20/23 1428  HGB 12.9  HCT 37.5    Assessment/Plan: Karen Crawford is a 22 y.o. G2P1011 s/p SVD at [redacted]w[redacted]d   PPD#1 - Doing well  Routine postpartum care  Desires additional time with Lactation today  Contraception: Slynd Feeding: breast Dispo: Plan for discharge late today or tomorrow.   LOS: 2 days   Wyn Forster, MD OB Fellow  08/22/2023, 7:01 AM

## 2023-08-22 NOTE — Anesthesia Postprocedure Evaluation (Signed)
Anesthesia Post Note  Patient: Karen Crawford  Procedure(s) Performed: AN AD HOC LABOR EPIDURAL     Patient location during evaluation: Mother Baby Anesthesia Type: Epidural Level of consciousness: awake and alert and oriented Pain management: satisfactory to patient Vital Signs Assessment: post-procedure vital signs reviewed and stable Respiratory status: respiratory function stable Cardiovascular status: stable Postop Assessment: no headache, no backache, epidural receding, patient able to bend at knees, no signs of nausea or vomiting, adequate PO intake and able to ambulate Anesthetic complications: no   No notable events documented.  Last Vitals:  Vitals:   08/21/23 1955 08/22/23 0540  BP: 130/68 124/81  Pulse: 83 63  Resp: 18 18  Temp: 36.7 C 36.6 C  SpO2: 98%     Last Pain:  Vitals:   08/22/23 0840  TempSrc:   PainSc: 0-No pain   Pain Goal:                   Jazari Ober

## 2023-08-23 MED ORDER — IBUPROFEN 600 MG PO TABS: 600.0000 mg | ORAL_TABLET | Freq: Four times a day (QID) | ORAL | 0 refills | Status: DC

## 2023-08-23 MED ORDER — SENNOSIDES-DOCUSATE SODIUM 8.6-50 MG PO TABS: 2.0000 | ORAL_TABLET | Freq: Every day | ORAL | Status: DC

## 2023-08-23 MED ORDER — DIBUCAINE (PERIANAL) 1 % EX OINT: 1.0000 | TOPICAL_OINTMENT | CUTANEOUS | Status: DC | PRN

## 2023-08-23 MED ORDER — COCONUT OIL OIL: 1.0000 | TOPICAL_OIL | Status: DC | PRN

## 2023-08-23 MED ORDER — SIMETHICONE 80 MG PO CHEW: 80.0000 mg | CHEWABLE_TABLET | ORAL | Status: DC | PRN

## 2023-08-23 MED ORDER — ACETAMINOPHEN 325 MG PO TABS: 650.0000 mg | ORAL_TABLET | ORAL | Status: AC | PRN

## 2023-08-23 MED ORDER — IBUPROFEN 600 MG PO TABS: 600.0000 mg | ORAL_TABLET | Freq: Four times a day (QID) | ORAL | 0 refills | Status: AC | PRN

## 2023-08-23 MED ORDER — WITCH HAZEL-GLYCERIN EX PADS: 1.0000 | MEDICATED_PAD | CUTANEOUS | 12 refills | Status: DC | PRN

## 2023-08-23 NOTE — Discharge Instructions (Signed)
Continue to check your blood pressures twice a day Call the office for blood pressures that are consistently above 140 for the top number or 90 for the bottom number   Hypertension During Pregnancy Hypertension is also called high blood pressure. High blood pressure means that the force of your blood moving in your body is too strong. It can cause problems for you and your baby. Different types of high blood pressure can happen during pregnancy. The types are: High blood pressure before you got pregnant. This is called chronic hypertension.  This can continue during your pregnancy. Your doctor will want to keep checking your blood pressure. You may need medicine to keep your blood pressure under control while you are pregnant. You will need follow-up visits after you have your baby. High blood pressure that goes up during pregnancy when it was normal before. This is called gestational hypertension. It will usually get better after you have your baby, but your doctor will need to watch your blood pressure to make sure that it is getting better. Very high blood pressure during pregnancy. This is called preeclampsia. Very high blood pressure is an emergency that needs to be checked and treated right away. You may develop very high blood pressure after giving birth. This is called postpartum preeclampsia. This usually occurs within 48 hours after childbirth but may occur up to 6 weeks after giving birth. This is rare. How does this affect me? If you have high blood pressure during pregnancy, you have a higher chance of developing high blood pressure: As you get older. If you get pregnant again. In some cases, high blood pressure during pregnancy can cause: Stroke. Heart attack. Damage to the kidneys, lungs, or liver. Preeclampsia. Jerky movements you cannot control (convulsions or seizures). Problems with the placenta.  What can I do to lower my risk?  Keep a healthy weight. Eat a healthy  diet. Follow what your doctor tells you about treating any medical problems that you had before becoming pregnant. It is very important to go to all of your doctor visits. Your doctor will check your blood pressure and make sure that your pregnancy is progressing as it should. Treatment should start early if a problem is found.  Follow these instructions at home:  Take your blood pressure 1-2 times per day. Call the office if your blood pressure is 155 or higher for the top number or 105 or higher for the bottom number.    Eating and drinking  Drink enough fluid to keep your pee (urine) pale yellow. Avoid caffeine. Lifestyle Do not use any products that contain nicotine or tobacco, such as cigarettes, e-cigarettes, and chewing tobacco. If you need help quitting, ask your doctor. Do not use alcohol or drugs. Avoid stress. Rest and get plenty of sleep. Regular exercise can help. Ask your doctor what kinds of exercise are best for you. General instructions Take over-the-counter and prescription medicines only as told by your doctor. Keep all prenatal and follow-up visits as told by your doctor. This is important. Contact a doctor if: You have symptoms that your doctor told you to watch for, such as: Headaches. Nausea. Vomiting. Belly (abdominal) pain. Dizziness. Light-headedness. Get help right away if: You have: Very bad belly pain that does not get better with treatment. A very bad headache that does not get better. Vomiting that does not get better. Sudden, fast weight gain. Sudden swelling in your hands, ankles, or face. Blood in your pee. Blurry vision. Double vision.  Shortness of breath. Chest pain. Weakness on one side of your body. Trouble talking. Summary High blood pressure is also called hypertension. High blood pressure means that the force of your blood moving in your body is too strong. High blood pressure can cause problems for you and your baby. Keep all  follow-up visits as told by your doctor. This is important. This information is not intended to replace advice given to you by your health care provider. Make sure you discuss any questions you have with your health care provider. Document Released: 12/13/2010 Document Revised: 03/03/2019 Document Reviewed: 12/07/2018 Elsevier Patient Education  2020 [employer].

## 2023-08-23 NOTE — Lactation Note (Signed)
This note was copied from a baby's chart. Lactation Consultation Note  Patient Name: Karen Crawford LKGMW'N Date: 08/23/2023 Age:23 hours Reason for consult: Follow-up assessment;1st time breastfeeding;Early term 37-38.6wks  P1, Mother giving baby bottle of breastmilk with Dr. Irving Burton bottle when [facility] entered room.  She recently pumped 20 ml.  She states she is pumping with manual pump with 18 mm flange but has 15 mm flange at home.  Discussed feeding 8-12 times per day at least q 3 hours.  Reviewed engorgement care and monitoring voids/stools.    Maternal Data Has patient been taught Hand Expression?: Yes  Feeding Mother's Current Feeding Choice: Breast Milk and Formula  Interventions Interventions: DEBP;Hand pump  Discharge Discharge Education: Engorgement and breast care;Warning signs for feeding baby Pump: Personal;DEBP  Consult Status Consult Status: Complete Date: 08/23/23    Dahlia Byes [facility] 08/23/2023, 11:52 AM

## 2023-08-24 ENCOUNTER — Telehealth (HOSPITAL_COMMUNITY): Payer: Self-pay | Admitting: *Deleted

## 2023-08-24 DIAGNOSIS — Z1331 Encounter for screening for depression: Secondary | ICD-10-CM

## 2023-08-24 LAB — SURGICAL PATHOLOGY

## 2023-08-24 NOTE — Telephone Encounter (Signed)
Patient scored 11 on EPDS in the hospital (question ten was 0).  Placed order for [facility] referral.  Dr. Crissie Reese notified via [facility] order.

## 2023-08-26 ENCOUNTER — Encounter: Payer: Medicaid Other | Admitting: Family Medicine

## 2023-08-28 ENCOUNTER — Ambulatory Visit: Payer: Medicaid Other | Admitting: Cardiology

## 2023-08-28 ENCOUNTER — Ambulatory Visit (INDEPENDENT_AMBULATORY_CARE_PROVIDER_SITE_OTHER): Payer: Medicaid Other | Admitting: *Deleted

## 2023-08-28 ENCOUNTER — Ambulatory Visit: Payer: Medicaid Other

## 2023-08-28 VITALS — BP 122/83 | HR 85 | Resp 16 | Ht 64.0 in | Wt 140.0 lb

## 2023-08-28 DIAGNOSIS — Z0130 Encounter for examination of blood pressure without abnormal findings: Secondary | ICD-10-CM

## 2023-08-28 NOTE — Progress Notes (Signed)
Pt requesting Micronor @ her PPV. BP today is normal(see vitals).  Mons incision is clean and dry.  A few hairs clipped around incision that were trying to grow inward.  Pt to continue to keep area clean and dry.  No sutures noted.  She will return @ PPV or PRN.

## 2023-09-02 ENCOUNTER — Encounter: Payer: Medicaid Other | Admitting: Medical

## 2023-09-07 ENCOUNTER — Encounter: Payer: Medicaid Other | Admitting: Obstetrics & Gynecology

## 2023-09-14 NOTE — BH Specialist Note (Signed)
Integrated Behavioral Health via Telemedicine Visit  09/28/2023 Karen Crawford [id number]  Number of Integrated Behavioral Health Clinician visits: 1- Initial Visit  Session Start time: 1316   Session End time: 1337  Total time in minutes: 21   Referring Provider: Merian Capron, MD Karen/Family location: Home [facility]  All persons participating in visit: Karen Crawford and [facility] Judd Mccubbin   Types of Service: Individual psychotherapy and Video visit  I connected with Alzina G Stotler and/or Iisha G Fyfe's  n/a  via  Telephone or Video Enabled Telemedicine Application  (Video is Caregility application) and verified that I am speaking with the correct person using two identifiers. Discussed confidentiality: Yes   I discussed the limitations of telemedicine and the availability of in person appointments.  Discussed there is a possibility of technology failure and discussed alternative modes of communication if that failure occurs.  I discussed that engaging in this telemedicine visit, they consent to the provision of behavioral healthcare and the services will be billed under their insurance.  Karen and/or legal guardian expressed understanding and consented to Telemedicine visit: Yes   Presenting Concerns: Karen and/or family reports the following symptoms/concerns: Fatigue,mild anxiety and fear/dread, attributes increase to early adjustment to new motherhood; pt feels well-supported at home, eating well and sleeping when baby sleeps; overall, feeling much better than anticipated postpartum; has not needed to take Vistaril postpartum; is taking Prozac as prescribed.  Duration of problem: Perinatal; Severity of problem: mild  Karen and/or Family's Strengths/Protective Factors: Social connections, Social and Emotional competence, Concrete supports in place (healthy food, safe environments, etc.),  Sense of purpose, and Physical Health (exercise, healthy diet, medication compliance, etc.)  Goals Addressed: Karen will:  Maintain reduction of  symptoms of: anxiety and depression   Increase knowledge and/or ability of: healthy habits   Demonstrate ability to: Increase healthy adjustment to current life circumstances  Progress towards Goals: Ongoing  Interventions: Interventions utilized:  Psychoeducation and/or Health Education, Link to [employer], and Supportive Reflection Standardized Assessments completed: GAD-7 and PHQ 9  Karen and/or Family Response: Karen agrees with treatment plan.   Assessment: Karen currently experiencing Generalized anxiety disorder.   Karen may benefit from psychoeducation and brief therapeutic interventions regarding maintaining reduction of  symptoms of anxiety and depression .  Plan: Follow up with behavioral health clinician on : Call Desmon Hitchner at [phone], as needed. Behavioral recommendations:  -Continue prioritizing healthy self-care (regular meals, adequate rest; allowing practical help from supportive friends and family) until at least postpartum medical appointment -Continue attending mom support at church; Consider new mom support group as needed at either [email or web address].net or [email or web address]  -Continue taking Prozac and Vistaril as prescribed Referral(s): Integrated Art gallery manager (In Clinic) and [employer]:  new mom support  I discussed the assessment and treatment plan with the Karen and/or parent/guardian. They were provided an opportunity to ask questions and all were answered. They agreed with the plan and demonstrated an understanding of the instructions.   They were advised to call back or seek an in-person evaluation if the symptoms worsen or if the condition fails to improve as anticipated.  Valetta Close Janzen Sacks, LCSW     09/28/2023    1:23 PM 04/08/2023    2:55 PM  Depression  screen PHQ 2/9  Decreased Interest 0 1  Down, Depressed, Hopeless 0 0  PHQ - 2 Score 0 1  Altered sleeping 0 3  Tired,  decreased energy 3 1  Change in appetite 0 0  Feeling bad or failure about yourself  0 0  Trouble concentrating 0 0  Moving slowly or fidgety/restless 0 0  Suicidal thoughts 0 0  PHQ-9 Score 3 5      09/28/2023    1:24 PM 04/08/2023    2:54 PM  GAD 7 : Generalized Anxiety Score  Nervous, Anxious, on Edge 2 3  Control/stop worrying 0 2  Worry too much - different things 0 2  Trouble relaxing 0 1  Restless 0 0  Easily annoyed or irritable 0 3  Afraid - awful might happen 1 1  Total GAD 7 Score 3 12

## 2023-09-16 ENCOUNTER — Telehealth (HOSPITAL_COMMUNITY): Payer: Self-pay | Admitting: *Deleted

## 2023-09-16 NOTE — Telephone Encounter (Signed)
09/16/2023  Name: Karen Crawford MRN: [id number] DOB: 2001-09-20  Reason for Call:  Transition of Care Hospital Discharge Call  Contact Status: Patient Contact Status: Complete  Language assistant needed: Interpreter Mode: Interpreter Not Needed        Follow-Up Questions: Do You Have Any Concerns About Your Health As You Heal From Delivery?: No Do You Have Any Concerns About Your Infants Health?: No  Edinburgh Postnatal Depression Scale:  In the Past 7 Days: I have been able to laugh and see the funny side of things.: As much as I always could I have looked forward with enjoyment to things.: As much as I ever did I have blamed myself unnecessarily when things went wrong.: Not very often I have been anxious or worried for no good reason.: Yes, sometimes I have felt scared or panicky for no good reason.: Yes, sometimes Things have been getting on top of me.: No, most of the time I have coped quite well I have been so unhappy that I have had difficulty sleeping.: Not at all I have felt sad or miserable.: No, not at all I have been so unhappy that I have been crying.: No, never The thought of harming myself has occurred to me.: Never Edinburgh Postnatal Depression Scale Total: 6  PHQ2-9 Depression Scale:     Discharge Follow-up: Edinburgh score requires follow up?: No Patient was advised of the following resources:: Breastfeeding Support Group, Support Group  Post-discharge interventions: Reviewed Newborn Safe Sleep Practices  [name] 09/16/2023 1325

## 2023-09-28 ENCOUNTER — Ambulatory Visit (INDEPENDENT_AMBULATORY_CARE_PROVIDER_SITE_OTHER): Payer: Medicaid Other | Admitting: Clinical

## 2023-09-28 DIAGNOSIS — F411 Generalized anxiety disorder: Secondary | ICD-10-CM

## 2023-09-28 NOTE — Patient Instructions (Addendum)
Center for [facility] at [facility] [address] [place], [place] [address] [phone] (main office) [phone] [facility] office)  New Parent Support Groups [email or web address] [email or web address]  [employer] (Low Income Risk manager) [email or web address]  [employer] (Low Income Automotive engineer) [email or web address]

## 2023-10-01 ENCOUNTER — Encounter: Payer: Self-pay | Admitting: Family Medicine

## 2023-10-01 ENCOUNTER — Ambulatory Visit: Payer: Medicaid Other | Admitting: Family Medicine

## 2023-10-01 ENCOUNTER — Other Ambulatory Visit (HOSPITAL_COMMUNITY)
Admission: RE | Admit: 2023-10-01 | Discharge: 2023-10-01 | Disposition: A | Discharge status: Home / Self Care | Payer: Medicaid Other | Source: Ambulatory Visit | Attending: Family Medicine | Admitting: Family Medicine

## 2023-10-01 DIAGNOSIS — N898 Other specified noninflammatory disorders of vagina: Secondary | ICD-10-CM

## 2023-10-01 DIAGNOSIS — L531 Erythema annulare centrifugum: Secondary | ICD-10-CM

## 2023-10-01 DIAGNOSIS — G43109 Migraine with aura, not intractable, without status migrainosus: Secondary | ICD-10-CM

## 2023-10-01 MED ORDER — NORETHINDRONE 0.35 MG PO TABS: 1.0000 | ORAL_TABLET | Freq: Every day | ORAL | 3 refills | Status: AC

## 2023-10-01 MED ORDER — METRONIDAZOLE 1.3 % VA GEL: 1.0000 | Freq: Every day | VAGINAL | 0 refills | Status: AC

## 2023-10-01 MED ORDER — SUMATRIPTAN SUCCINATE 100 MG PO TABS: 100.0000 mg | ORAL_TABLET | Freq: Once | ORAL | 11 refills | Status: AC | PRN

## 2023-10-01 NOTE — Progress Notes (Signed)
Post Partum Visit Note  Karen Crawford is a 22 y.o. G59P1011 female who presents for a postpartum visit. She is 5 weeks postpartum following a normal spontaneous vaginal delivery.  I have fully reviewed the prenatal and intrapartum course. The delivery was at 37 gestational weeks.  Anesthesia: epidural. Postpartum course has been normal. Baby is doing well. Baby is feeding by bottle - Similac 360 Total Care . Bleeding no bleeding. Bowel function is normal. Bladder function is normal. Patient is not sexually active. Contraception method is oral progesterone-only contraceptive. Postpartum depression screening: negative.  She has had a rash on the right side of her face over the past several weeks that has been getting worse.  She tried to put some hydrocortisone cream on it without improvement.  She is now switched to Monistat cream.  She finds that this does feel like it is burning the area where the rash is affected.  Rash has gotten larger.  No apparent palliating or provoking factors.  The rash is very itchy.   The pregnancy intention screening data noted above was reviewed. Potential methods of contraception were discussed. The patient elected to proceed with No data recorded.    Health Maintenance Due  Topic Date Due   HPV VACCINES (1 - 3-dose series) Never done   Hepatitis C Screening  Never done   DTaP/Tdap/Td (1 - Tdap) Never done   INFLUENZA VACCINE  Never done   COVID-19 Vaccine (1 - 2023-24 season) Never done    The following portions of the patient's history were reviewed and updated as appropriate: allergies, current medications, past family history, past medical history, past social history, past surgical history, and problem list.  Review of Systems Pertinent items are noted in HPI.  Objective:  LMP 12/04/2022    General:  alert, cooperative, and no distress   Breasts:  not indicated  Lungs: clear to auscultation bilaterally  Heart:  regular rate and rhythm, S1, S2  normal, no murmur, click, rub or gallop  Abdomen: soft, non-tender; bowel sounds normal; no masses,  no organomegaly   Skin: Annular rash going from behind the ear across jaw and up onto the patient's left cheek.  There is approximately 7 cm in diameter.  Wound N/a  GU exam:  not indicated       Assessment:   1. Postpartum exam  2. Vaginal irritation Some vaginal irritation.  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 Erythema annulare centrifugum Triamcinolone prescribed. Will refer to dermatology. If not improved in 3-4 weeks, then will try antibiotics. - Ambulatory referral to Dermatology  4. Migraine with aura and without status migrainosus, not intractable Imitrex filled   Plan:   Essential components of care per ACOG recommendations:  1.  Mood and well being: Patient with negative depression screening today. Reviewed local resources for support.  - Patient tobacco use? No.   - hx of drug use? No.    2. Infant care and feeding:  -Patient currently breastmilk feeding? No.  -Social determinants of health (SDOH) reviewed in EPIC. No concerns  3. Sexuality, contraception and birth spacing - Patient does not want a pregnancy in the next year.   - Reviewed reproductive life planning. Reviewed contraceptive methods based on pt preferences and effectiveness.  Patient desired Oral Contraceptive today.   - Discussed birth spacing of 18 months  4. Sleep and fatigue -Encouraged family/partner/community support of 4 hrs of uninterrupted sleep to help with mood  and fatigue  5. Physical Recovery  - Discussed patients delivery and complications. She describes her labor as good. - Patient had a Vaginal, no problems at delivery. Patient had a  vaginal abrasion . Perineal healing reviewed. Patient expressed understanding - Patient has urinary incontinence? No. - Patient is safe to resume physical and sexual activity  6.  Health Maintenance - HM due  items addressed Yes - Last pap smear No results found for: "DIAGPAP" Pap smear not done at today's visit.  Last PAP 2023 - normal (in CareEverywhere) -Breast Cancer screening indicated? No.   7. Chronic Disease/Pregnancy Condition follow up: None  - PCP follow up  Karen Crawford l Karen Crawford, CMA Center for [employer], [facility]

## 2023-10-05 LAB — CERVICOVAGINAL ANCILLARY ONLY
Bacterial Vaginitis (gardnerella): POSITIVE — AB
Candida Glabrata: NEGATIVE
Candida Vaginitis: NEGATIVE
Comment: NEGATIVE
Comment: NEGATIVE
Comment: NEGATIVE

## 2023-10-06 ENCOUNTER — Telehealth: Payer: Self-pay

## 2023-10-06 DIAGNOSIS — N760 Acute vaginitis: Secondary | ICD-10-CM

## 2023-10-06 MED ORDER — METRONIDAZOLE 500 MG PO TABS: 500.0000 mg | ORAL_TABLET | Freq: Two times a day (BID) | ORAL | 0 refills | Status: AC

## 2023-10-06 NOTE — Telephone Encounter (Signed)
Called patient to inform her that she tested positive for BV. Mychart message will be sent to patient.  Karen Crawford l Sahej Schrieber, CMA

## 2023-10-07 ENCOUNTER — Telehealth: Payer: Self-pay | Admitting: Family Medicine

## 2023-10-07 MED ORDER — TRIAMCINOLONE ACETONIDE 0.5 % EX CREA: 1.0000 | TOPICAL_CREAM | Freq: Three times a day (TID) | CUTANEOUS | 0 refills | Status: AC

## 2023-10-07 NOTE — Telephone Encounter (Signed)
I resent the cream.

## 2023-10-07 NOTE — Telephone Encounter (Signed)
-----   Message from Nurse Mardella Layman I sent at 10/07/2023  9:11 AM EST ----- Regarding: FW: Cream  ----- Message ----- From: Talbert Cage Sent: 10/06/2023   4:14 PM EST To: [facility] Clinical Subject: Cream                                          Patient was supposed to be prescribed a hydrocortisone cream for her face and it was not sent to her pharmacy.   Pharmacy is [facility] [address] [place] - [address] [place], [place] [address]
